# Patient Record
Sex: Female | Born: 1940 | Race: White | Hispanic: No | Smoking: Never smoker
Health system: Southern US, Community
[De-identification: ages and names within clinical notes are randomized; demographics above are authoritative.]

## PROBLEM LIST (undated history)

## (undated) DIAGNOSIS — M810 Age-related osteoporosis without current pathological fracture: Secondary | ICD-10-CM

## (undated) DIAGNOSIS — F419 Anxiety disorder, unspecified: Secondary | ICD-10-CM

## (undated) DIAGNOSIS — C801 Malignant (primary) neoplasm, unspecified: Secondary | ICD-10-CM

## (undated) DIAGNOSIS — M1990 Unspecified osteoarthritis, unspecified site: Secondary | ICD-10-CM

## (undated) HISTORY — PX: NO PAST SURGERIES: SHX2092

## (undated) HISTORY — DX: Age-related osteoporosis without current pathological fracture: M81.0

---

## 2014-08-04 ENCOUNTER — Other Ambulatory Visit (HOSPITAL_COMMUNITY): Payer: Self-pay | Admitting: Preventative Medicine

## 2014-08-04 DIAGNOSIS — M5416 Radiculopathy, lumbar region: Secondary | ICD-10-CM

## 2014-08-06 ENCOUNTER — Ambulatory Visit (HOSPITAL_COMMUNITY): Payer: Medicare Other

## 2016-08-09 ENCOUNTER — Other Ambulatory Visit: Payer: Self-pay | Admitting: Physical Medicine and Rehabilitation

## 2016-08-09 DIAGNOSIS — M5410 Radiculopathy, site unspecified: Secondary | ICD-10-CM

## 2016-10-31 ENCOUNTER — Ambulatory Visit: Payer: Self-pay | Admitting: Orthopedic Surgery

## 2016-11-09 ENCOUNTER — Ambulatory Visit: Payer: Self-pay | Admitting: Orthopedic Surgery

## 2016-11-09 NOTE — H&P (Signed)
Natalie Tran DOB: 02-01-1941 Single / Language: Cleophus Tran / Race: White Female Date of Admission:  11/22/2016 CC:  Right Hip Pain History of Present Illness The patient is a 76 year old female who comes in for a preoperative History and Physical. The patient is scheduled for a right total hip arthroplasty (anterior) to be performed by Dr. Dione Plover. Aluisio, MD at [facility] on 11-22-2016. The patient is a 76 year old female who presented with a hip problem. The patient was seen in referral from Dr. Tonita Cong. The patient reports right hip problems including pain symptoms that have been present for year(s). The symptoms began without any known injury. Symptoms reported include hip pain, night pain, stiffness, difficulty flexing hip, difficulty bearing weight and difficulty ambulating The patient reports symptoms radiating to the: right groin and right thigh. The patient describes the hip problem as aching.The patient feels as if their symptoms are does feel they are worsening. Symptoms are exacerbated by flexing hip, weight bearing and lying on the affected side. Symptoms are relieved by rest. Previous treatment for this problem has included nonsteroidal anti-inflammatory drugs (ibuprofen, prn) and opioid analgesics (Percocet). She recently had right L4-5 injection. She says that the injection helped with the pain she was having in her back. Prior to the injection, she was also having pain in the left leg. She states the left leg pain has completely resolved. er right hip, unfortunately, has gotten progressively worse in the past several months. It has been bothering her for long time, but it has gotten much worse now. She has had chronic problems with her back, but those pale in comparison to pain she is having in the hip. It is in the groin and lateral hip, radiating down the thigh to her knee. She is at a point where it was hurting all the time as well as hurting at night. It is limiting her  ability to do activities of daily living let alone do any kind of recreational activity. She even has difficulty sleeping at night. She had recent back injections that helped the back pain, but did not help at all with the hip pain. The left hip is not bothering her at this time. She is now ready to get the hip replaced at this time. They have been treated conservatively in the past for the above stated problem and despite conservative measures, they continue to have progressive pain and severe functional limitations and dysfunction. They have failed non-operative management including home exercise, medications, and injections. It is felt that they would benefit from undergoing total joint replacement. Risks and benefits of the procedure have been discussed with the patient and they elect to proceed with surgery. There are no active contraindications to surgery such as ongoing infection or rapidly progressive neurological disease.  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)  Sciatica of right side (M54.31)  Primary osteoarthritis of right hip (M16.11)  HNP (herniated nucleus pulposus), lumbar (M51.26)  Lumbar spine pain (M54.5)   Allergies  Sulfa Drugs   Family History  First Degree Relatives  reported No pertinent family history   Social History Children  5 or more Current work status  working full time Living situation  live alone Marital status  divorced Never consumed alcohol  09/10/2014: Never consumed alcohol  No history of drug/alcohol rehab  Not under pain contract  Number of flights of stairs before winded  less than 1 Tobacco / smoke exposure  09/10/2014: no Tobacco use  Never smoker. 09/10/2014  Medication History   Percocet (5-325MG  Tablet, Oral) Active. Ibuprofen (800MG  Tablet, Oral) Active.  Past Surgical History No pertinent past surgical history   Review of Systems General Not Present- Chills, Fatigue, Fever, Memory Loss, Night Sweats, Weight Gain and Weight Loss. Skin Not Present- Eczema, Hives, Itching, Lesions and Rash. HEENT Not Present- Dentures, Double Vision, Headache, Hearing Loss, Tinnitus and Visual Loss. Respiratory Not Present- Allergies, Chronic Cough, Coughing up blood, Shortness of breath at rest and Shortness of breath with exertion. Cardiovascular Not Present- Chest Pain, Difficulty Breathing Lying Down, Murmur, Palpitations, Racing/skipping heartbeats and Swelling. Gastrointestinal Not Present- Abdominal Pain, Bloody Stool, Constipation, Diarrhea, Difficulty Swallowing, Heartburn, Jaundice, Loss of appetitie, Nausea and Vomiting. Female Genitourinary Not Present- Blood in Urine, Discharge, Flank Pain, Incontinence, Painful Urination, Urgency, Urinary frequency, Urinary Retention, Urinating at Night and Weak urinary stream. Musculoskeletal Present- Joint Pain. Not Present- Back Pain, Joint Swelling, Morning Stiffness, Muscle Pain, Muscle Weakness and Spasms. Neurological Not Present- Blackout spells, Difficulty with balance, Dizziness, Paralysis, Tremor and Weakness. Psychiatric Not Present- Insomnia.  Vitals  Weight: 130 lb Height: 64in Weight was reported by patient. Height was reported by patient. Body Surface Area: 1.63 m Body Mass Index: 22.31 kg/m  Pulse: 84 (Regular)  Resp.: 16 (Unlabored)  BP: 142/76 (Sitting, Left Arm, Standard)   Physical Exam General Mental Status -Alert, cooperative and good historian. General Appearance-pleasant, Not in acute distress. Orientation-Oriented X3. Build & Nutrition-Petite, Well nourished and Well developed.  Head and Neck Head-normocephalic, atraumatic . Neck Global Assessment - supple, no bruit  auscultated on the right, no bruit auscultated on the left.  Eye Pupil - Bilateral-Regular and Round. Motion - Bilateral-EOMI.  ENMT Note: partial upper plate   Chest and Lung Exam Auscultation Breath sounds - clear at anterior chest wall and clear at posterior chest wall. Adventitious sounds - No Adventitious sounds.  Cardiovascular Auscultation Rhythm - Regular rate and rhythm. Heart Sounds - S1 WNL and S2 WNL. Murmurs & Other Heart Sounds - Auscultation of the heart reveals - No Murmurs.  Abdomen Palpation/Percussion Tenderness - Abdomen is non-tender to palpation. Rigidity (guarding) - Abdomen is soft. Auscultation Auscultation of the abdomen reveals - Bowel sounds normal.  Female Genitourinary Note: Not done, not pertinent to present illness  Musculoskeletal Note: A well-developed female, in no distress. Left hip has normal range of motion with no discomfort. Right hip can be flexed to 100, minimal internal rotation, about 20 degrees external rotation, 20 degrees abduction. She has pain on range of motion. She is about half inch short while standing. The right leg is shorter than the left. Knee exam is normal. There is just some patellofemoral crepitus, but otherwise normal exam. Pulse, sensation, motor intact. She has significantly antalgic gait pattern on the right.  RADIOGRAPHS AP pelvis and lateral of the right hip taken today show severe bone on bone arthritis with erosion of about 10 to 15% of her superolateral femoral head. The femoral head has flattened in that area. There is osteophyte formation. Left hip is minimally involved.  Assessment & Plan  Primary osteoarthritis of right hip (M16.11)  Note:Surgical Plans: Right Total Hip Replacement - Anterior Approach  Disposition: Home with family, Plan for HHPT after the hospital stay  PCP: Dr. Wenda Overland - [facility], [place]  IV TXA  Anesthesia Issues: None  Patient was instructed on what medications to stop prior  to surgery.  Signed electronically by Ok Edwards, III PA-C

## 2016-11-14 NOTE — Patient Instructions (Addendum)
Natalie Tran  11/14/2016   Your procedure is scheduled on:   Report to [facility] Main  Entrance  To check in with admitting at    200 PM.   Call this number if you have problems the morning of surgery [phone]   Remember: ONLY 1 PERSON MAY GO WITH YOU TO SHORT STAY TO GET  READY MORNING OF [place].   Do not eat food:After Midnight. YOU MAY HAVE CLEAR LIQUIDS FROM MIDNIGHT TILL 800 AM THEN NOTHING BY MOUTH    CLEAR LIQUID DIET   Foods Allowed                                                                     Foods Excluded  Coffee and tea, regular and decaf                             liquids that you cannot  Plain Jell-O in any flavor                                             see through such as: Fruit ices (not with fruit pulp)                                     milk, soups, orange juice  Iced Popsicles                                    All solid food Carbonated beverages, regular and diet                                    Cranberry, grape and apple juices Sports drinks like Gatorade Lightly seasoned clear broth or consume(fat free) Sugar, honey syrup  Sample Menu Breakfast                                Lunch                                     Supper Cranberry juice                    Beef broth                            Chicken broth Jell-O                                     Grape juice  Apple juice Coffee or tea                        Jell-O                                      Popsicle                                                Coffee or tea                        Coffee or tea  _____________________________________________________________________     Take these medicines the morning of surgery with A SIP OF WATER: NONE                                You may not have any metal on your body including hair pins and              piercings  Do not wear jewelry, make-up, lotions, powders or  perfumes, deodorant             Do not wear nail polish.  Do not shave  48 hours prior to surgery.            Do not bring valuables to the hospital. [place].  Contacts, dentures or bridgework may not be worn into surgery.  Leave suitcase in the car. After surgery it may be brought to your room.                 Please read over the following fact sheets you were given: _____________________________________________________________________             [facility] - Preparing for Surgery Before surgery, you can play an important role.  Because skin is not sterile, your skin needs to be as free of germs as possible.  You can reduce the number of germs on your skin by washing with CHG (chlorahexidine gluconate) soap before surgery.  CHG is an antiseptic cleaner which kills germs and bonds with the skin to continue killing germs even after washing. Please DO NOT use if you have an allergy to CHG or antibacterial soaps.  If your skin becomes reddened/irritated stop using the CHG and inform your nurse when you arrive at Short Stay. Do not shave (including legs and underarms) for at least 48 hours prior to the first CHG shower.  You may shave your face/neck. Please follow these instructions carefully:  1.  Shower with CHG Soap the night before surgery and the  morning of Surgery.  2.  If you choose to wash your hair, wash your hair first as usual with your  normal  shampoo.  3.  After you shampoo, rinse your hair and body thoroughly to remove the  shampoo.                           4.  Use CHG as you would any other liquid soap.  You can apply chg directly  to the  skin and wash                       Gently with a scrungie or clean washcloth.  5.  Apply the CHG Soap to your body ONLY FROM THE NECK DOWN.   Do not use on face/ open                           Wound or open sores. Avoid contact with eyes, ears mouth and genitals (private parts).                        Wash face,  Genitals (private parts) with your normal soap.             6.  Wash thoroughly, paying special attention to the area where your surgery  will be performed.  7.  Thoroughly rinse your body with warm water from the neck down.  8.  DO NOT shower/wash with your normal soap after using and rinsing off  the CHG Soap.                9.  Pat yourself dry with a clean towel.            10.  Wear clean pajamas.            11.  Place clean sheets on your bed the night of your first shower and do not  sleep with pets. Day of Surgery : Do not apply any lotions/deodorants the morning of surgery.  Please wear clean clothes to the hospital/surgery center.  FAILURE TO FOLLOW THESE INSTRUCTIONS MAY RESULT IN THE CANCELLATION OF YOUR SURGERY PATIENT SIGNATURE_________________________________  NURSE SIGNATURE__________________________________  ________________________________________________________________________  WHAT IS A BLOOD TRANSFUSION? Blood Transfusion Information  A transfusion is the replacement of blood or some of its parts. Blood is made up of multiple cells which provide different functions.  Red blood cells carry oxygen and are used for blood loss replacement.  White blood cells fight against infection.  Platelets control bleeding.  Plasma helps clot blood.  Other blood products are available for specialized needs, such as hemophilia or other clotting disorders. BEFORE THE TRANSFUSION  Who gives blood for transfusions?   Healthy volunteers who are fully evaluated to make sure their blood is safe. This is blood bank blood. Transfusion therapy is the safest it has ever been in the practice of medicine. Before blood is taken from a donor, a complete history is taken to make sure that person has no history of diseases nor engages in risky social behavior (examples are intravenous drug use or sexual activity with multiple partners). The donor's travel history is  screened to minimize risk of transmitting infections, such as malaria. The donated blood is tested for signs of infectious diseases, such as HIV and hepatitis. The blood is then tested to be sure it is compatible with you in order to minimize the chance of a transfusion reaction. If you or a relative donates blood, this is often done in anticipation of surgery and is not appropriate for emergency situations. It takes many days to process the donated blood. RISKS AND COMPLICATIONS Although transfusion therapy is very safe and saves many lives, the main dangers of transfusion include:   Getting an infectious disease.  Developing a transfusion reaction. This is an allergic reaction to something in the blood you were given. Every precaution is taken to prevent this.  The decision to have a blood transfusion has been considered carefully by your caregiver before blood is given. Blood is not given unless the benefits outweigh the risks. AFTER THE TRANSFUSION  Right after receiving a blood transfusion, you will usually feel much better and more energetic. This is especially true if your red blood cells have gotten low (anemic). The transfusion raises the level of the red blood cells which carry oxygen, and this usually causes an energy increase.  The nurse administering the transfusion will monitor you carefully for complications. HOME CARE INSTRUCTIONS  No special instructions are needed after a transfusion. You may find your energy is better. Speak with your caregiver about any limitations on activity for underlying diseases you may have. SEEK MEDICAL CARE IF:   Your condition is not improving after your transfusion.  You develop redness or irritation at the intravenous (IV) site. SEEK IMMEDIATE MEDICAL CARE IF:  Any of the following symptoms occur over the next 12 hours:  Shaking chills.  You have a temperature by mouth above 102 F (38.9 C), not controlled by medicine.  Chest, back, or muscle  pain.  People around you feel you are not acting correctly or are confused.  Shortness of breath or difficulty breathing.  Dizziness and fainting.  You get a rash or develop hives.  You have a decrease in urine output.  Your urine turns a dark color or changes to pink, red, or brown. Any of the following symptoms occur over the next 10 days:  You have a temperature by mouth above 102 F (38.9 C), not controlled by medicine.  Shortness of breath.  Weakness after normal activity.  The white part of the eye turns yellow (jaundice).  You have a decrease in the amount of urine or are urinating less often.  Your urine turns a dark color or changes to pink, red, or brown. Document Released: 08/11/2000 Document Revised: 11/06/2011 Document Reviewed: 03/30/2008 ExitCare Patient Information 2014 [place].  _______________________________________________________________________  Incentive Spirometer  An incentive spirometer is a tool that can help keep your lungs clear and active. This tool measures how well you are filling your lungs with each breath. Taking long deep breaths may help reverse or decrease the chance of developing breathing (pulmonary) problems (especially infection) following:  A long period of time when you are unable to move or be active. BEFORE THE PROCEDURE   If the spirometer includes an indicator to show your best effort, your nurse or respiratory therapist will set it to a desired goal.  If possible, sit up straight or lean slightly forward. Try not to slouch.  Hold the incentive spirometer in an upright position. INSTRUCTIONS FOR USE  1. Sit on the edge of your bed if possible, or sit up as far as you can in bed or on a chair. 2. Hold the incentive spirometer in an upright position. 3. Breathe out normally. 4. Place the mouthpiece in your mouth and seal your lips tightly around it. 5. Breathe in slowly and as deeply as possible, raising the piston  or the ball toward the top of the column. 6. Hold your breath for 3-5 seconds or for as long as possible. Allow the piston or ball to fall to the bottom of the column. 7. Remove the mouthpiece from your mouth and breathe out normally. 8. Rest for a few seconds and repeat Steps 1 through 7 at least 10 times every 1-2 hours when you are awake. Take your time and take a  few normal breaths between deep breaths. 9. The spirometer may include an indicator to show your best effort. Use the indicator as a goal to work toward during each repetition. 10. After each set of 10 deep breaths, practice coughing to be sure your lungs are clear. If you have an incision (the cut made at the time of surgery), support your incision when coughing by placing a pillow or rolled up towels firmly against it. Once you are able to get out of bed, walk around indoors and cough well. You may stop using the incentive spirometer when instructed by your caregiver.  RISKS AND COMPLICATIONS  Take your time so you do not get dizzy or light-headed.  If you are in pain, you may need to take or ask for pain medication before doing incentive spirometry. It is harder to take a deep breath if you are having pain. AFTER USE  Rest and breathe slowly and easily.  It can be helpful to keep track of a log of your progress. Your caregiver can provide you with a simple table to help with this. If you are using the spirometer at home, follow these instructions: [place] IF:   You are having difficultly using the spirometer.  You have trouble using the spirometer as often as instructed.  Your pain medication is not giving enough relief while using the spirometer.  You develop fever of 100.5 F (38.1 C) or higher. SEEK IMMEDIATE MEDICAL CARE IF:   You cough up bloody sputum that had not been present before.  You develop fever of 102 F (38.9 C) or greater.  You develop worsening pain at or near the incision site. MAKE  SURE YOU:   Understand these instructions.  Will watch your condition.  Will get help right away if you are not doing well or get worse. Document Released: 12/25/2006 Document Revised: 11/06/2011 Document Reviewed: 02/25/2007 [facility] Patient Information 2014 [place], [place].   ________________________________________________________________________

## 2016-11-15 ENCOUNTER — Encounter (INDEPENDENT_AMBULATORY_CARE_PROVIDER_SITE_OTHER): Payer: Self-pay

## 2016-11-15 ENCOUNTER — Encounter (HOSPITAL_COMMUNITY)
Admission: RE | Admit: 2016-11-15 | Discharge: 2016-11-15 | Disposition: A | Discharge status: Home / Self Care | Payer: Medicare Other | Source: Ambulatory Visit | Attending: Orthopedic Surgery | Admitting: Orthopedic Surgery

## 2016-11-15 ENCOUNTER — Encounter (HOSPITAL_COMMUNITY): Payer: Self-pay

## 2016-11-15 DIAGNOSIS — M1611 Unilateral primary osteoarthritis, right hip: Secondary | ICD-10-CM | POA: Insufficient documentation

## 2016-11-15 DIAGNOSIS — Z01812 Encounter for preprocedural laboratory examination: Secondary | ICD-10-CM | POA: Insufficient documentation

## 2016-11-15 HISTORY — DX: Anxiety disorder, unspecified: F41.9

## 2016-11-15 HISTORY — DX: Malignant (primary) neoplasm, unspecified: C80.1

## 2016-11-15 HISTORY — DX: Unspecified osteoarthritis, unspecified site: M19.90

## 2016-11-15 LAB — COMPREHENSIVE METABOLIC PANEL
ALT: 16 U/L (ref 14–54)
ANION GAP: 9 (ref 5–15)
AST: 23 U/L (ref 15–41)
Albumin: 4.3 g/dL (ref 3.5–5.0)
Alkaline Phosphatase: 81 U/L (ref 38–126)
BUN: 17 mg/dL (ref 6–20)
CHLORIDE: 103 mmol/L (ref 101–111)
CO2: 28 mmol/L (ref 22–32)
Calcium: 10.3 mg/dL (ref 8.9–10.3)
Creatinine, Ser: 0.73 mg/dL (ref 0.44–1.00)
GFR calc non Af Amer: >60 mL/min (ref >60)
Glucose, Bld: 96 mg/dL (ref 65–99)
POTASSIUM: 4.8 mmol/L (ref 3.5–5.1)
SODIUM: 140 mmol/L (ref 135–145)
Total Bilirubin: 0.5 mg/dL (ref 0.3–1.2)
Total Protein: 8.1 g/dL (ref 6.5–8.1)

## 2016-11-15 LAB — CBC
HCT: 40.7 % (ref 36.0–46.0)
Hemoglobin: 13.3 g/dL (ref 12.0–15.0)
MCH: 30.4 pg (ref 26.0–34.0)
MCHC: 32.7 g/dL (ref 30.0–36.0)
MCV: 93.1 fL (ref 78.0–100.0)
PLATELETS: 195 10*3/uL (ref 150–400)
RBC: 4.37 MIL/uL (ref 3.87–5.11)
RDW: 14 % (ref 11.5–15.5)
WBC: 7.6 10*3/uL (ref 4.0–10.5)

## 2016-11-15 LAB — PROTIME-INR
INR: 0.96
Prothrombin Time: 12.8 seconds (ref 11.4–15.2)

## 2016-11-15 LAB — ABO/RH: ABO/RH(D): O POS

## 2016-11-15 LAB — SURGICAL PCR SCREEN
MRSA, PCR: INVALID — AB
Staphylococcus aureus: INVALID — AB

## 2016-11-15 LAB — APTT: aPTT: 30 seconds (ref 24–36)

## 2016-11-18 LAB — MRSA CULTURE: Culture: NOT DETECTED

## 2016-11-20 NOTE — Progress Notes (Signed)
Spoke with [facility] office regarding her surgery time change. Pt. Was informed to be at Short stay on the first floor on 11/22/16 at 0510 am and to not eat or drink anything after midnight. Patient VU.

## 2016-11-22 ENCOUNTER — Encounter (HOSPITAL_COMMUNITY)
Admission: RE | Disposition: A | Discharge status: Home Health | Payer: Self-pay | Source: Ambulatory Visit | Attending: Orthopedic Surgery

## 2016-11-22 ENCOUNTER — Inpatient Hospital Stay (HOSPITAL_COMMUNITY): Payer: Medicare Other | Admitting: Anesthesiology

## 2016-11-22 ENCOUNTER — Inpatient Hospital Stay (HOSPITAL_COMMUNITY)
Admission: RE | Admit: 2016-11-22 | Discharge: 2016-11-23 | DRG: 470 | Disposition: A | Discharge status: Home Health | Payer: Medicare Other | Source: Ambulatory Visit | Attending: Orthopedic Surgery | Admitting: Orthopedic Surgery

## 2016-11-22 ENCOUNTER — Encounter (HOSPITAL_COMMUNITY): Payer: Self-pay | Admitting: *Deleted

## 2016-11-22 ENCOUNTER — Inpatient Hospital Stay (HOSPITAL_COMMUNITY): Payer: Medicare Other

## 2016-11-22 DIAGNOSIS — M48062 Spinal stenosis, lumbar region with neurogenic claudication: Secondary | ICD-10-CM | POA: Diagnosis present

## 2016-11-22 DIAGNOSIS — Z882 Allergy status to sulfonamides status: Secondary | ICD-10-CM

## 2016-11-22 DIAGNOSIS — M5136 Other intervertebral disc degeneration, lumbar region: Secondary | ICD-10-CM | POA: Diagnosis present

## 2016-11-22 DIAGNOSIS — M1611 Unilateral primary osteoarthritis, right hip: Secondary | ICD-10-CM | POA: Diagnosis present

## 2016-11-22 DIAGNOSIS — M169 Osteoarthritis of hip, unspecified: Secondary | ICD-10-CM | POA: Diagnosis present

## 2016-11-22 DIAGNOSIS — M48061 Spinal stenosis, lumbar region without neurogenic claudication: Secondary | ICD-10-CM | POA: Diagnosis present

## 2016-11-22 DIAGNOSIS — M5126 Other intervertebral disc displacement, lumbar region: Secondary | ICD-10-CM | POA: Diagnosis present

## 2016-11-22 DIAGNOSIS — Z96649 Presence of unspecified artificial hip joint: Secondary | ICD-10-CM

## 2016-11-22 HISTORY — PX: TOTAL HIP ARTHROPLASTY: SHX124

## 2016-11-22 LAB — TYPE AND SCREEN
ABO/RH(D): O POS
ANTIBODY SCREEN: NEGATIVE

## 2016-11-22 SURGERY — ARTHROPLASTY, HIP, TOTAL, ANTERIOR APPROACH
Anesthesia: Monitor Anesthesia Care | Site: Hip | Laterality: Right

## 2016-11-22 MED ORDER — DIPHENHYDRAMINE HCL 12.5 MG/5ML PO ELIX: 12.5000 mg | ORAL_SOLUTION | ORAL | Status: DC | PRN

## 2016-11-22 MED ORDER — RIVAROXABAN 10 MG PO TABS
10.0000 mg | ORAL_TABLET | Freq: Every day | ORAL | Status: DC
  Administered 2016-11-23: 10 mg via ORAL
  Filled 2016-11-22: qty 1

## 2016-11-22 MED ORDER — SODIUM CHLORIDE 0.9 % IR SOLN
Status: DC | PRN
  Administered 2016-11-22: 1000 mL

## 2016-11-22 MED ORDER — ONDANSETRON HCL 4 MG/2ML IJ SOLN
INTRAMUSCULAR | Status: DC | PRN
  Administered 2016-11-22: 4 mg via INTRAVENOUS

## 2016-11-22 MED ORDER — CHLORHEXIDINE GLUCONATE 4 % EX LIQD: 60.0000 mL | Freq: Once | CUTANEOUS | Status: DC

## 2016-11-22 MED ORDER — TRAMADOL HCL 50 MG PO TABS: 50.0000 mg | ORAL_TABLET | Freq: Four times a day (QID) | ORAL | Status: DC | PRN

## 2016-11-22 MED ORDER — ONDANSETRON HCL 4 MG/2ML IJ SOLN: 4.0000 mg | Freq: Four times a day (QID) | INTRAMUSCULAR | Status: DC | PRN

## 2016-11-22 MED ORDER — PHENYLEPHRINE 40 MCG/ML (10ML) SYRINGE FOR IV PUSH (FOR BLOOD PRESSURE SUPPORT)
PREFILLED_SYRINGE | INTRAVENOUS | Status: AC
  Filled 2016-11-22: qty 10

## 2016-11-22 MED ORDER — FLEET ENEMA 7-19 GM/118ML RE ENEM: 1.0000 | ENEMA | Freq: Once | RECTAL | Status: DC | PRN

## 2016-11-22 MED ORDER — OXYCODONE HCL 5 MG/5ML PO SOLN: 5.0000 mg | Freq: Once | ORAL | Status: DC | PRN

## 2016-11-22 MED ORDER — PROMETHAZINE HCL 25 MG/ML IJ SOLN: 6.2500 mg | INTRAMUSCULAR | Status: DC | PRN

## 2016-11-22 MED ORDER — DOCUSATE SODIUM 100 MG PO CAPS
100.0000 mg | ORAL_CAPSULE | Freq: Two times a day (BID) | ORAL | Status: DC
  Administered 2016-11-22 – 2016-11-23 (×2): 100 mg via ORAL
  Filled 2016-11-22 (×2): qty 1

## 2016-11-22 MED ORDER — LIDOCAINE 2% (20 MG/ML) 5 ML SYRINGE
INTRAMUSCULAR | Status: AC
  Filled 2016-11-22: qty 5

## 2016-11-22 MED ORDER — PHENOL 1.4 % MT LIQD: 1.0000 | OROMUCOSAL | Status: DC | PRN

## 2016-11-22 MED ORDER — OXYCODONE HCL 5 MG PO TABS: 5.0000 mg | ORAL_TABLET | Freq: Once | ORAL | Status: DC | PRN

## 2016-11-22 MED ORDER — MENTHOL 3 MG MT LOZG: 1.0000 | LOZENGE | OROMUCOSAL | Status: DC | PRN

## 2016-11-22 MED ORDER — HYDROMORPHONE HCL 1 MG/ML IJ SOLN: 0.2500 mg | INTRAMUSCULAR | Status: DC | PRN

## 2016-11-22 MED ORDER — LACTATED RINGERS IV SOLN
INTRAVENOUS | Status: DC
  Administered 2016-11-22 (×2): via INTRAVENOUS

## 2016-11-22 MED ORDER — MIDAZOLAM HCL 2 MG/2ML IJ SOLN
INTRAMUSCULAR | Status: AC
  Filled 2016-11-22: qty 2

## 2016-11-22 MED ORDER — PROPOFOL 10 MG/ML IV BOLUS
INTRAVENOUS | Status: DC | PRN
  Administered 2016-11-22: 100 mg via INTRAVENOUS
  Administered 2016-11-22: 20 mg via INTRAVENOUS

## 2016-11-22 MED ORDER — BUPIVACAINE HCL (PF) 0.25 % IJ SOLN
INTRAMUSCULAR | Status: AC
  Filled 2016-11-22: qty 30

## 2016-11-22 MED ORDER — EPHEDRINE 5 MG/ML INJ
INTRAVENOUS | Status: AC
  Filled 2016-11-22: qty 10

## 2016-11-22 MED ORDER — POLYETHYLENE GLYCOL 3350 17 G PO PACK: 17.0000 g | PACK | Freq: Every day | ORAL | Status: DC | PRN

## 2016-11-22 MED ORDER — ONDANSETRON HCL 4 MG PO TABS: 4.0000 mg | ORAL_TABLET | Freq: Four times a day (QID) | ORAL | Status: DC | PRN

## 2016-11-22 MED ORDER — CEFAZOLIN SODIUM-DEXTROSE 2-4 GM/100ML-% IV SOLN
2.0000 g | INTRAVENOUS | Status: AC
  Administered 2016-11-22: 2 g via INTRAVENOUS

## 2016-11-22 MED ORDER — ONDANSETRON HCL 4 MG/2ML IJ SOLN
INTRAMUSCULAR | Status: AC
  Filled 2016-11-22: qty 4

## 2016-11-22 MED ORDER — ACETAMINOPHEN 325 MG PO TABS: 650.0000 mg | ORAL_TABLET | Freq: Four times a day (QID) | ORAL | Status: DC | PRN

## 2016-11-22 MED ORDER — ACETAMINOPHEN 650 MG RE SUPP: 650.0000 mg | Freq: Four times a day (QID) | RECTAL | Status: DC | PRN

## 2016-11-22 MED ORDER — ACETAMINOPHEN 10 MG/ML IV SOLN
INTRAVENOUS | Status: AC
  Filled 2016-11-22: qty 100

## 2016-11-22 MED ORDER — BUPIVACAINE HCL (PF) 0.5 % IJ SOLN
INTRAMUSCULAR | Status: AC
  Filled 2016-11-22: qty 30

## 2016-11-22 MED ORDER — ACETAMINOPHEN 500 MG PO TABS
1000.0000 mg | ORAL_TABLET | Freq: Four times a day (QID) | ORAL | Status: AC
  Administered 2016-11-22 – 2016-11-23 (×4): 1000 mg via ORAL
  Filled 2016-11-22 (×4): qty 2

## 2016-11-22 MED ORDER — MORPHINE SULFATE (PF) 4 MG/ML IV SOLN
1.0000 mg | INTRAVENOUS | Status: DC | PRN
  Filled 2016-11-22: qty 1

## 2016-11-22 MED ORDER — BUPIVACAINE HCL (PF) 0.25 % IJ SOLN
INTRAMUSCULAR | Status: DC | PRN
  Administered 2016-11-22: 30 mL

## 2016-11-22 MED ORDER — ACETAMINOPHEN 10 MG/ML IV SOLN
1000.0000 mg | Freq: Once | INTRAVENOUS | Status: AC
Start: 2016-11-22 — End: 2016-11-22
  Administered 2016-11-22: 1000 mg via INTRAVENOUS

## 2016-11-22 MED ORDER — METHOCARBAMOL 500 MG PO TABS
500.0000 mg | ORAL_TABLET | Freq: Four times a day (QID) | ORAL | Status: DC | PRN
  Administered 2016-11-23 (×2): 500 mg via ORAL
  Filled 2016-11-22 (×3): qty 1

## 2016-11-22 MED ORDER — PROPOFOL 500 MG/50ML IV EMUL
INTRAVENOUS | Status: DC | PRN
  Administered 2016-11-22: 50 ug/kg/min via INTRAVENOUS

## 2016-11-22 MED ORDER — OXYCODONE HCL 5 MG PO TABS
5.0000 mg | ORAL_TABLET | ORAL | Status: DC | PRN
  Administered 2016-11-22 (×3): 5 mg via ORAL
  Administered 2016-11-22: 10 mg via ORAL
  Administered 2016-11-23: 5 mg via ORAL
  Administered 2016-11-23 (×2): 10 mg via ORAL
  Administered 2016-11-23: 5 mg via ORAL
  Filled 2016-11-22: qty 2
  Filled 2016-11-22: qty 1
  Filled 2016-11-22: qty 2
  Filled 2016-11-22 (×2): qty 1
  Filled 2016-11-22 (×2): qty 2
  Filled 2016-11-22 (×2): qty 1
  Filled 2016-11-22: qty 2

## 2016-11-22 MED ORDER — SODIUM CHLORIDE 0.9 % IV SOLN
INTRAVENOUS | Status: DC
  Administered 2016-11-22 – 2016-11-23 (×2): via INTRAVENOUS

## 2016-11-22 MED ORDER — LIDOCAINE 2% (20 MG/ML) 5 ML SYRINGE
INTRAMUSCULAR | Status: DC | PRN
  Administered 2016-11-22: 60 mg via INTRAVENOUS

## 2016-11-22 MED ORDER — PROPOFOL 10 MG/ML IV BOLUS
INTRAVENOUS | Status: AC
  Filled 2016-11-22: qty 60

## 2016-11-22 MED ORDER — SODIUM CHLORIDE 0.9 % IV SOLN
1000.0000 mg | INTRAVENOUS | Status: AC
  Administered 2016-11-22: 1000 mg via INTRAVENOUS
  Filled 2016-11-22: qty 1100

## 2016-11-22 MED ORDER — MIDAZOLAM HCL 2 MG/2ML IJ SOLN
INTRAMUSCULAR | Status: DC | PRN
  Administered 2016-11-22 (×2): 1 mg via INTRAVENOUS

## 2016-11-22 MED ORDER — DEXAMETHASONE SODIUM PHOSPHATE 10 MG/ML IJ SOLN
10.0000 mg | Freq: Once | INTRAMUSCULAR | Status: AC
  Administered 2016-11-22: 10 mg via INTRAVENOUS

## 2016-11-22 MED ORDER — METOCLOPRAMIDE HCL 5 MG PO TABS: 5.0000 mg | ORAL_TABLET | Freq: Three times a day (TID) | ORAL | Status: DC | PRN

## 2016-11-22 MED ORDER — DEXAMETHASONE SODIUM PHOSPHATE 10 MG/ML IJ SOLN
10.0000 mg | Freq: Once | INTRAMUSCULAR | Status: AC
  Administered 2016-11-23: 10 mg via INTRAVENOUS
  Filled 2016-11-22: qty 1

## 2016-11-22 MED ORDER — PHENYLEPHRINE HCL 10 MG/ML IJ SOLN
INTRAMUSCULAR | Status: DC | PRN
  Administered 2016-11-22 (×7): 80 ug via INTRAVENOUS

## 2016-11-22 MED ORDER — EPHEDRINE SULFATE 50 MG/ML IJ SOLN
INTRAMUSCULAR | Status: DC | PRN
  Administered 2016-11-22: 10 mg via INTRAVENOUS

## 2016-11-22 MED ORDER — DEXAMETHASONE SODIUM PHOSPHATE 10 MG/ML IJ SOLN
INTRAMUSCULAR | Status: AC
  Filled 2016-11-22: qty 1

## 2016-11-22 MED ORDER — METHOCARBAMOL 1000 MG/10ML IJ SOLN
500.0000 mg | Freq: Four times a day (QID) | INTRAVENOUS | Status: DC | PRN
  Administered 2016-11-22: 500 mg via INTRAVENOUS
  Filled 2016-11-22: qty 5
  Filled 2016-11-22: qty 550

## 2016-11-22 MED ORDER — BISACODYL 10 MG RE SUPP: 10.0000 mg | Freq: Every day | RECTAL | Status: DC | PRN

## 2016-11-22 MED ORDER — TRANEXAMIC ACID 1000 MG/10ML IV SOLN
1000.0000 mg | Freq: Once | INTRAVENOUS | Status: AC
  Administered 2016-11-22: 1000 mg via INTRAVENOUS
  Filled 2016-11-22: qty 1100

## 2016-11-22 MED ORDER — CEFAZOLIN SODIUM-DEXTROSE 2-4 GM/100ML-% IV SOLN
2.0000 g | Freq: Four times a day (QID) | INTRAVENOUS | Status: AC
  Administered 2016-11-22 (×2): 2 g via INTRAVENOUS
  Filled 2016-11-22 (×2): qty 100

## 2016-11-22 MED ORDER — METOCLOPRAMIDE HCL 5 MG/ML IJ SOLN: 5.0000 mg | Freq: Three times a day (TID) | INTRAMUSCULAR | Status: DC | PRN

## 2016-11-22 MED ORDER — CEFAZOLIN SODIUM-DEXTROSE 2-4 GM/100ML-% IV SOLN
INTRAVENOUS | Status: AC
  Filled 2016-11-22: qty 100

## 2016-11-22 SURGICAL SUPPLY — 35 items
BAG DECANTER FOR FLEXI CONT (MISCELLANEOUS) ×3 IMPLANT
BAG ZIPLOCK 12X15 (MISCELLANEOUS) IMPLANT
BLADE SAG 18X100X1.27 (BLADE) ×3 IMPLANT
CAPT HIP TOTAL 2 ×3 IMPLANT
CLOSURE WOUND 1/2 X4 (GAUZE/BANDAGES/DRESSINGS) ×1
CLOTH BEACON ORANGE TIMEOUT ST (SAFETY) ×3 IMPLANT
COVER PERINEAL POST (MISCELLANEOUS) ×3 IMPLANT
DECANTER SPIKE VIAL GLASS SM (MISCELLANEOUS) ×3 IMPLANT
DRAPE STERI IOBAN 125X83 (DRAPES) ×3 IMPLANT
DRAPE U-SHAPE 47X51 STRL (DRAPES) ×6 IMPLANT
DRSG ADAPTIC 3X8 NADH LF (GAUZE/BANDAGES/DRESSINGS) ×3 IMPLANT
DRSG MEPILEX BORDER 4X4 (GAUZE/BANDAGES/DRESSINGS) ×3 IMPLANT
DRSG MEPILEX BORDER 4X8 (GAUZE/BANDAGES/DRESSINGS) ×3 IMPLANT
DURAPREP 26ML APPLICATOR (WOUND CARE) ×3 IMPLANT
ELECT REM PT RETURN 15FT ADLT (MISCELLANEOUS) ×3 IMPLANT
EVACUATOR 1/8 PVC DRAIN (DRAIN) ×3 IMPLANT
GLOVE BIO SURGEON STRL SZ7.5 (GLOVE) ×3 IMPLANT
GLOVE BIO SURGEON STRL SZ8 (GLOVE) ×6 IMPLANT
GLOVE BIOGEL PI IND STRL 6.5 (GLOVE) ×4 IMPLANT
GLOVE BIOGEL PI IND STRL 8 (GLOVE) ×2 IMPLANT
GLOVE BIOGEL PI INDICATOR 6.5 (GLOVE) ×8
GLOVE BIOGEL PI INDICATOR 8 (GLOVE) ×4
GOWN STRL REUS W/TWL LRG LVL3 (GOWN DISPOSABLE) ×9 IMPLANT
GOWN STRL REUS W/TWL XL LVL3 (GOWN DISPOSABLE) ×3 IMPLANT
PACK ANTERIOR HIP CUSTOM (KITS) ×3 IMPLANT
STRIP CLOSURE SKIN 1/2X4 (GAUZE/BANDAGES/DRESSINGS) ×2 IMPLANT
SUT ETHIBOND NAB CT1 #1 30IN (SUTURE) ×3 IMPLANT
SUT MNCRL AB 4-0 PS2 18 (SUTURE) ×3 IMPLANT
SUT STRATAFIX 0 PDS 27 VIOLET (SUTURE) ×3
SUT VIC AB 2-0 CT1 27 (SUTURE) ×4
SUT VIC AB 2-0 CT1 TAPERPNT 27 (SUTURE) ×2 IMPLANT
SUTURE STRATFX 0 PDS 27 VIOLET (SUTURE) ×1 IMPLANT
SYR 50ML LL SCALE MARK (SYRINGE) IMPLANT
TRAY FOLEY CATH 14FRSI W/METER (CATHETERS) ×3 IMPLANT
YANKAUER SUCT BULB TIP 10FT TU (MISCELLANEOUS) ×3 IMPLANT

## 2016-11-22 NOTE — Anesthesia Postprocedure Evaluation (Signed)
Anesthesia Post Note  Patient: Olar Santini  Procedure(s) Performed: Procedure(s) (LRB): RIGHT TOTAL HIP ARTHROPLASTY ANTERIOR APPROACH (Right)  Patient location during evaluation: PACU Anesthesia Type: MAC Level of consciousness: awake and alert Pain management: pain level controlled Vital Signs Assessment: post-procedure vital signs reviewed and stable Respiratory status: spontaneous breathing, nonlabored ventilation and respiratory function stable Cardiovascular status: blood pressure returned to baseline and stable Postop Assessment: no signs of nausea or vomiting Anesthetic complications: no       Last Vitals:  Vitals:   11/22/16 0950 11/22/16 1013  BP: (!) 130/58 131/77  Pulse: 70 80  Resp: 14 14  Temp: 36.4 C 36.9 C    Last Pain:  Vitals:   11/22/16 0545  TempSrc: Oral                 Lynda Rainwater

## 2016-11-22 NOTE — Interval H&P Note (Signed)
History and Physical Interval Note:  11/22/2016 6:36 AM  Natalie Tran  has presented today for surgery, with the diagnosis of Osteoarthritis right hip  The various methods of treatment have been discussed with the patient and family. After consideration of risks, benefits and other options for treatment, the patient has consented to  Procedure(s): RIGHT TOTAL HIP ARTHROPLASTY ANTERIOR APPROACH (Right) as a surgical intervention .  The patient's history has been reviewed, patient examined, no change in status, stable for surgery.  I have reviewed the patient's chart and labs.  Questions were answered to the patient's satisfaction.     [name]

## 2016-11-22 NOTE — Anesthesia Procedure Notes (Signed)
Spinal  Patient location during procedure: OR Start time: 11/22/2016 7:20 AM End time: 11/22/2016 7:25 AM Staffing Anesthesiologist: Candida Peeling RAY Performed: anesthesiologist  Preanesthetic Checklist Completed: patient identified, site marked, surgical consent, pre-op evaluation, timeout performed, IV checked, risks and benefits discussed and monitors and equipment checked Spinal Block Patient position: sitting Prep: Betadine Patient monitoring: continuous pulse ox, blood pressure, cardiac monitor and heart rate Approach: midline Location: L3-4 Injection technique: single-shot Needle Needle type: Quincke  Needle gauge: 22 G Assessment Events: failed spinal Additional Notes Patient reacted to incision.  LMA placed without difficulty.

## 2016-11-22 NOTE — Anesthesia Preprocedure Evaluation (Signed)
Anesthesia Evaluation  Patient identified by MRN, date of birth, ID band Patient awake    Reviewed: Allergy & Precautions, NPO status , Patient's Chart, lab work & pertinent test results  Airway Mallampati: II  TM Distance: >3 FB Neck ROM: Full    Dental no notable dental hx.    Pulmonary neg pulmonary ROS,    Pulmonary exam normal breath sounds clear to auscultation       Cardiovascular negative cardio ROS Normal cardiovascular exam Rhythm:Regular Rate:Normal     Neuro/Psych negative neurological ROS  negative psych ROS   GI/Hepatic negative GI ROS, Neg liver ROS,   Endo/Other  negative endocrine ROS  Renal/GU negative Renal ROS  negative genitourinary   Musculoskeletal negative musculoskeletal ROS (+) Arthritis ,   Abdominal   Peds negative pediatric ROS (+)  Hematology negative hematology ROS (+)   Anesthesia Other Findings   Reproductive/Obstetrics negative OB ROS                             Anesthesia Physical Anesthesia Plan  ASA: II  Anesthesia Plan: Spinal and MAC   Post-op Pain Management:    Induction: Intravenous  Airway Management Planned: Simple Face Mask  Additional Equipment:   Intra-op Plan:   Post-operative Plan:   Informed Consent: I have reviewed the patients History and Physical, chart, labs and discussed the procedure including the risks, benefits and alternatives for the proposed anesthesia with the patient or authorized representative who has indicated his/her understanding and acceptance.   Dental advisory given  Plan Discussed with: CRNA  Anesthesia Plan Comments:         Anesthesia Quick Evaluation

## 2016-11-22 NOTE — H&P (View-Only) (Signed)
Natalie Tran DOB: May 20, 1941 Single / Language: [name] / Race: White Female Date of Admission:  11/22/2016 CC:  Right Hip Pain History of Present Illness The patient is a 76 year old female who comes in for a preoperative History and Physical. The patient is scheduled for a right total hip arthroplasty (anterior) to be performed by Dr. Dione Plover. Aluisio, MD at [facility] on 11-22-2016. The patient is a 76 year old female who presented with a hip problem. The patient was seen in referral from Dr. Tonita Cong. The patient reports right hip problems including pain symptoms that have been present for year(s). The symptoms began without any known injury. Symptoms reported include hip pain, night pain, stiffness, difficulty flexing hip, difficulty bearing weight and difficulty ambulating The patient reports symptoms radiating to the: right groin and right thigh. The patient describes the hip problem as aching.The patient feels as if their symptoms are does feel they are worsening. Symptoms are exacerbated by flexing hip, weight bearing and lying on the affected side. Symptoms are relieved by rest. Previous treatment for this problem has included nonsteroidal anti-inflammatory drugs (ibuprofen, prn) and opioid analgesics (Percocet). She recently had right L4-5 injection. She says that the injection helped with the pain she was having in her back. Prior to the injection, she was also having pain in the left leg. She states the left leg pain has completely resolved. er right hip, unfortunately, has gotten progressively worse in the past several months. It has been bothering her for long time, but it has gotten much worse now. She has had chronic problems with her back, but those pale in comparison to pain she is having in the hip. It is in the groin and lateral hip, radiating down the thigh to her knee. She is at a point where it was hurting all the time as well as hurting at night. It is limiting her  ability to do activities of daily living let alone do any kind of recreational activity. She even has difficulty sleeping at night. She had recent back injections that helped the back pain, but did not help at all with the hip pain. The left hip is not bothering her at this time. She is now ready to get the hip replaced at this time. They have been treated conservatively in the past for the above stated problem and despite conservative measures, they continue to have progressive pain and severe functional limitations and dysfunction. They have failed non-operative management including home exercise, medications, and injections. It is felt that they would benefit from undergoing total joint replacement. Risks and benefits of the procedure have been discussed with the patient and they elect to proceed with surgery. There are no active contraindications to surgery such as ongoing infection or rapidly progressive neurological disease.  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 09/10/2014  Medication History   Percocet (5-325MG  Tablet, Oral) Active. Ibuprofen (800MG  Tablet, Oral) Active.  Past Surgical History No pertinent past surgical history   Review of Systems General Not Present- Chills, Fatigue, Fever, Memory Loss, Night Sweats, Weight Gain and Weight Loss. Skin Not Present- Eczema, Hives, Itching, Lesions and Rash. HEENT Not Present- Dentures, Double Vision, Headache, Hearing Loss, Tinnitus and Visual Loss. Respiratory Not Present- Allergies, Chronic Cough, Coughing up blood, Shortness of breath at rest and Shortness of breath with exertion. Cardiovascular Not Present- Chest Pain, Difficulty Breathing Lying Down, Murmur, Palpitations, Racing/skipping heartbeats and Swelling. Gastrointestinal Not Present- Abdominal Pain, Bloody Stool, Constipation, Diarrhea, Difficulty Swallowing, Heartburn, Jaundice, Loss of appetitie, Nausea and Vomiting. Female Genitourinary Not Present- Blood in Urine, Discharge, Flank Pain, Incontinence, Painful Urination, Urgency, Urinary frequency, Urinary Retention, Urinating at Night and Weak urinary stream. Musculoskeletal Present- Joint Pain. Not Present- Back Pain, Joint Swelling, Morning Stiffness, Muscle Pain, Muscle Weakness and Spasms. Neurological Not Present- Blackout spells, Difficulty with balance, Dizziness, Paralysis, Tremor and Weakness. Psychiatric Not Present- Insomnia.  Vitals  Weight: 130 lb Height: 64in Weight was reported by patient. Height was reported by patient. Body Surface Area: 1.63 m Body Mass Index: 22.31 kg/m  Pulse: 84 (Regular)  Resp.: 16 (Unlabored)  BP: 142/76 (Sitting, Left Arm, Standard)   Physical Exam General Mental Status -Alert, cooperative and good historian. General Appearance-pleasant, Not in acute distress. Orientation-Oriented X3. Build & Nutrition-Petite, Well nourished and Well developed.  Head and Neck Head-normocephalic, atraumatic . Neck Global Assessment - supple, no bruit  auscultated on the right, no bruit auscultated on the left.  Eye Pupil - Bilateral-Regular and Round. Motion - Bilateral-EOMI.  ENMT Note: partial upper plate   Chest and Lung Exam Auscultation Breath sounds - clear at anterior chest wall and clear at posterior chest wall. Adventitious sounds - No Adventitious sounds.  Cardiovascular Auscultation Rhythm - Regular rate and rhythm. Heart Sounds - S1 WNL and S2 WNL. Murmurs & Other Heart Sounds - Auscultation of the heart reveals - No Murmurs.  Abdomen Palpation/Percussion Tenderness - Abdomen is non-tender to palpation. Rigidity (guarding) - Abdomen is soft. Auscultation Auscultation of the abdomen reveals - Bowel sounds normal.  Female Genitourinary Note: Not done, not pertinent to present illness  Musculoskeletal Note: A well-developed female, in no distress. Left hip has normal range of motion with no discomfort. Right hip can be flexed to 100, minimal internal rotation, about 20 degrees external rotation, 20 degrees abduction. She has pain on range of motion. She is about half inch short while standing. The right leg is shorter than the left. Knee exam is normal. There is just some patellofemoral crepitus, but otherwise normal exam. Pulse, sensation, motor intact. She has significantly antalgic gait pattern on the right.  RADIOGRAPHS AP pelvis and lateral of the right hip taken today show severe bone on bone arthritis with erosion of about 10 to 15% of her superolateral femoral head. The femoral head has flattened in that area. There is osteophyte formation. Left hip is minimally involved.  Assessment & Plan  Primary osteoarthritis of right hip (M16.11)  Note:Surgical Plans: Right Total Hip Replacement - Anterior Approach  Disposition: Home with family, Plan for HHPT after the hospital stay  PCP: Dr. Wenda Overland - [facility], [place]  IV [facility]  Anesthesia Issues: None  Patient was instructed on what medications to stop prior  to surgery.  Signed electronically by Ok Edwards, III PA-C

## 2016-11-22 NOTE — Anesthesia Procedure Notes (Signed)
Procedure Name: MAC Date/Time: 11/22/2016 7:27 AM Performed by: Dione Booze Pre-anesthesia Checklist: Patient identified, Emergency Drugs available, Suction available and Patient being monitored Oxygen Delivery Method: Simple face mask Placement Confirmation: positive ETCO2

## 2016-11-22 NOTE — Anesthesia Procedure Notes (Signed)
Procedure Name: LMA Insertion Date/Time: 11/22/2016 7:54 AM Performed by: Dione Booze Pre-anesthesia Checklist: Emergency Drugs available, Suction available, Patient being monitored and Patient identified Patient Re-evaluated:Patient Re-evaluated prior to inductionOxygen Delivery Method: Circle system utilized Preoxygenation: Pre-oxygenation with 100% oxygen Intubation Type: IV induction LMA: LMA with gastric port inserted LMA Size: 4.0 Number of attempts: 1 Placement Confirmation: positive ETCO2 and breath sounds checked- equal and bilateral Tube secured with: Tape Dental Injury: Teeth and Oropharynx as per pre-operative assessment

## 2016-11-22 NOTE — Evaluation (Signed)
Physical Therapy Evaluation Patient Details Name: Natalie Tran MRN: [id number] DOB: 02/19/1941 Today's Date: 11/22/2016   History of Present Illness  Pt s/p R THR and   Clinical Impression  Pt s.p R THR and presents with decreased R LE strength/ROM and post op pain limiting functional mobility.  Pt should progress to dc home with family assist.    Follow Up Recommendations Home health PT    Equipment Recommendations  None recommended by PT    Recommendations for Other Services       Precautions / Restrictions Precautions Precautions: Fall Restrictions Weight Bearing Restrictions: No Other Position/Activity Restrictions: WBAT      Mobility  Bed Mobility Overal bed mobility: Needs Assistance Bed Mobility: Supine to Sit     Supine to sit: Min assist;Mod assist     General bed mobility comments: cues for sequence and use of L LE to self assist.  Physical assist to manage R LE and to bring trunk to upright  Transfers Overall transfer level: Needs assistance Equipment used: Rolling walker (2 wheeled) Transfers: Sit to/from Stand Sit to Stand: Min assist;Mod assist         General transfer comment: cues for LE management and use of UEs to self assist  Ambulation/Gait Ambulation/Gait assistance: Min assist Ambulation Distance (Feet): 50 Feet Assistive device: Rolling walker (2 wheeled) Gait Pattern/deviations: Step-to pattern;Decreased step length - right;Decreased step length - left;Shuffle;Trunk flexed Gait velocity: decr Gait velocity interpretation: Below normal speed for age/gender General Gait Details: cues for sequence, posture and position from [employer]            Wheelchair Mobility    Modified Rankin (Stroke Patients Only)       Balance                                             Pertinent Vitals/Pain Pain Assessment: 0-10 Pain Score: 4  Pain Location: R hip Pain Descriptors / Indicators: Aching;Sore Pain  Intervention(s): Limited activity within patient's tolerance;Monitored during session;Premedicated before session;Ice applied    Home Living Family/patient expects to be discharged to:: Private residence Living Arrangements: Children Available Help at Discharge: Family Type of Home: House Home Access: Stairs to enter;Ramped entrance   Entrance Stairs-Number of Steps: 1 Home Layout: One level Home Equipment: Environmental consultant - 4 wheels;Cane - single point      Prior Function Level of Independence: Independent with assistive device(s)         Comments: Rollator vs cane for ambulation     Hand Dominance        Extremity/Trunk Assessment   Upper Extremity Assessment Upper Extremity Assessment: Overall WFL for tasks assessed    Lower Extremity Assessment Lower Extremity Assessment: RLE deficits/detail    Cervical / Trunk Assessment Cervical / Trunk Assessment: Normal  Communication   Communication: No difficulties  Cognition Arousal/Alertness: Awake/alert Behavior During Therapy: WFL for tasks assessed/performed Overall Cognitive Status: Within Functional Limits for tasks assessed                                        General Comments      Exercises     Assessment/Plan    PT Assessment Patient needs continued PT services  PT Problem List Decreased strength;Decreased range of motion;Decreased activity tolerance;Decreased  balance;Decreased mobility;Decreased knowledge of use of DME;Pain       PT Treatment Interventions DME instruction;Gait training;Stair training;Functional mobility training;Therapeutic activities;Therapeutic exercise;Patient/family education    PT Goals (Current goals can be found in the Care Plan section)  Acute Rehab PT Goals Patient Stated Goal: get back to dancing PT Goal Formulation: With patient Time For Goal Achievement: 11/25/16 Potential to Achieve Goals: Good    Frequency 7X/week   Barriers to discharge         Co-evaluation               End of Session Equipment Utilized During Treatment: Gait belt Activity Tolerance: Patient tolerated treatment well Patient left: in chair;with call bell/phone within reach;with family/visitor present Nurse Communication: Mobility status PT Visit Diagnosis: Difficulty in walking, not elsewhere classified (R26.2)    Time: 7001-7494 PT Time Calculation (min) (ACUTE ONLY): 28 min   Charges:   PT Evaluation $PT Eval Low Complexity: 1 Procedure PT Treatments $Gait Training: 8-22 mins   PT G Codes:          Jathniel Smeltzer 11/22/2016, 5:53 PM

## 2016-11-22 NOTE — Transfer of Care (Signed)
Immediate Anesthesia Transfer of Care Note  Patient: Natalie Tran  Procedure(s) Performed: Procedure(s): RIGHT TOTAL HIP ARTHROPLASTY ANTERIOR APPROACH (Right)  Patient Location: PACU  Anesthesia Type:General and Spinal  Level of Consciousness: awake, alert , oriented and patient cooperative  Airway & Oxygen Therapy: Patient Spontanous Breathing and Patient connected to face mask oxygen  Post-op Assessment: Report given to RN and Post -op Vital signs reviewed and stable  Post vital signs: Reviewed and stable  Last Vitals:  Vitals:   11/22/16 0545  BP: (!) 138/91  Pulse: 85  Temp: 36.9 C    Last Pain:  Vitals:   11/22/16 0545  TempSrc: Oral      Patients Stated Pain Goal: 4 (48/83/01 4159)  Complications: No apparent anesthesia complications

## 2016-11-22 NOTE — Op Note (Signed)
OPERATIVE REPORT- TOTAL HIP ARTHROPLASTY   PREOPERATIVE DIAGNOSIS: Osteoarthritis of the Right hip.   POSTOPERATIVE DIAGNOSIS: Osteoarthritis of the Right  hip.   PROCEDURE: Right total hip arthroplasty, anterior approach.   SURGEON: Gaynelle Arabian, MD   ASSISTANT: Arlee Muslim, PA-C  ANESTHESIA:  General  ESTIMATED BLOOD LOSS:-250 ml   DRAINS: Hemovac x1.   COMPLICATIONS: None   CONDITION: PACU - hemodynamically stable.   BRIEF CLINICAL NOTE: Natalie Tran is a 76 y.o. female who has advanced end-  stage arthritis of their Right  hip with progressively worsening pain and  dysfunction.The patient has failed nonoperative management and presents for  total hip arthroplasty.   PROCEDURE IN DETAIL: After successful administration of spinal  anesthetic, the traction boots for the [facility] bed were placed on both  feet and the patient was placed onto the [facility] bed, boots placed into the leg  holders. The Right hip was then isolated from the perineum with plastic  drapes and prepped and draped in the usual sterile fashion. ASIS and  greater trochanter were marked and a oblique incision was made, starting  at about 1 cm lateral and 2 cm distal to the ASIS and coursing towards  the anterior cortex of the femur. The skin was cut with a 10 blade  through subcutaneous tissue to the level of the fascia overlying the  tensor fascia lata muscle. The fascia was then incised in line with the  incision at the junction of the anterior third and posterior 2/3rd. The  muscle was teased off the fascia and then the interval between the TFL  and the rectus was developed. The Hohmann retractor was then placed at  the top of the femoral neck over the capsule. The vessels overlying the  capsule were cauterized and the fat on top of the capsule was removed.  A Hohmann retractor was then placed anterior underneath the rectus  femoris to give exposure to the entire anterior capsule. A T-shaped   capsulotomy was performed. The edges were tagged and the femoral head  was identified.       Osteophytes are removed off the superior acetabulum.  The femoral neck was then cut in situ with an oscillating saw. Traction  was then applied to the left lower extremity utilizing the [facility]  traction. The femoral head was then removed. Retractors were placed  around the acetabulum and then circumferential removal of the labrum was  performed. Osteophytes were also removed. Reaming starts at 45 mm to  medialize and  Increased in 2 mm increments to 47 mm. We reamed in  approximately 40 degrees of abduction, 20 degrees anteversion. A 48 mm  pinnacle acetabular shell was then impacted in anatomic position under  fluoroscopic guidance with excellent purchase. We did not need to place  any additional dome screws. A 28 mm neutral + 4 marathon liner was then  placed into the acetabular shell.       The femoral lift was then placed along the lateral aspect of the femur  just distal to the vastus ridge. The leg was  externally rotated and capsule  was stripped off the inferior aspect of the femoral neck down to the  level of the lesser trochanter, this was done with electrocautery. The femur was lifted after this was performed. The  leg was then placed in an extended and adducted position essentially delivering the femur. We also removed the capsule superiorly and the piriformis from the piriformis fossa to  gain excellent exposure of the  proximal femur. Rongeur was used to remove some cancellous bone to get  into the lateral portion of the proximal femur for placement of the  initial starter reamer. The starter broaches was placed  the starter broach  and was shown to go down the center of the canal. Broaching  with the  Corail system was then performed starting at size 8, coursing  Up to size 13. A size 13 had excellent torsional and rotational  and axial stability. The trial high offset neck was then  placed  with a 28 + 1.5 trial head. The hip was then reduced. We confirmed that  the stem was in the canal both on AP and lateral x-rays. It also has excellent sizing. The hip was reduced with outstanding stability through full extension and full external rotation.. AP pelvis was taken and the leg lengths were measured and found to be equal. Hip was then dislocated again and the femoral head and neck removed. The  femoral broach was removed. Size 13 Corail stem with a high offset  neck was then impacted into the femur following native anteversion. Has  excellent purchase in the canal. Excellent torsional and rotational and  axial stability. It is confirmed to be in the canal on AP and lateral  fluoroscopic views. The 28 + 1.5 ceramic head was placed and the hip  reduced with outstanding stability. Again AP pelvis was taken and it  confirmed that the leg lengths were equal. The wound was then copiously  irrigated with saline solution and the capsule reattached and repaired  with Ethibond suture. 30 ml of .25% Bupivicaine was  injected into the capsule and into the edge of the tensor fascia lata as well as subcutaneous tissue. The fascia overlying the tensor fascia lata was then closed with a running #1 V-Loc. Subcu was closed with interrupted 2-0 Vicryl and subcuticular running 4-0 Monocryl. Incision was cleaned  and dried. Steri-Strips and a bulky sterile dressing applied. Hemovac  drain was hooked to suction and then the patient was awakened and transported to  recovery in stable condition.        Please note that a surgical assistant was a medical necessity for this procedure to perform it in a safe and expeditious manner. Assistant was necessary to provide appropriate retraction of vital neurovascular structures and to prevent femoral fracture and allow for anatomic placement of the prosthesis.  Gaynelle Arabian, M.D.

## 2016-11-23 LAB — BASIC METABOLIC PANEL
Anion gap: 5 (ref 5–15)
BUN: 10 mg/dL (ref 6–20)
CHLORIDE: 109 mmol/L (ref 101–111)
CO2: 28 mmol/L (ref 22–32)
Calcium: 8.9 mg/dL (ref 8.9–10.3)
Creatinine, Ser: 0.65 mg/dL (ref 0.44–1.00)
Glucose, Bld: 114 mg/dL — ABNORMAL HIGH (ref 65–99)
Potassium: 3.6 mmol/L (ref 3.5–5.1)
SODIUM: 142 mmol/L (ref 135–145)

## 2016-11-23 LAB — CBC
HCT: 31.2 % — ABNORMAL LOW (ref 36.0–46.0)
HEMOGLOBIN: 10.3 g/dL — AB (ref 12.0–15.0)
MCH: 31.1 pg (ref 26.0–34.0)
MCHC: 33 g/dL (ref 30.0–36.0)
MCV: 94.3 fL (ref 78.0–100.0)
PLATELETS: 160 10*3/uL (ref 150–400)
RBC: 3.31 MIL/uL — AB (ref 3.87–5.11)
RDW: 14.2 % (ref 11.5–15.5)
WBC: 9.7 10*3/uL (ref 4.0–10.5)

## 2016-11-23 MED ORDER — TRAMADOL HCL 50 MG PO TABS: 50.0000 mg | ORAL_TABLET | Freq: Four times a day (QID) | ORAL | 0 refills | Status: DC | PRN

## 2016-11-23 MED ORDER — SODIUM CHLORIDE 0.9 % IV BOLUS (SEPSIS): 250.0000 mL | Freq: Once | INTRAVENOUS | Status: DC

## 2016-11-23 MED ORDER — METHOCARBAMOL 500 MG PO TABS: 500.0000 mg | ORAL_TABLET | Freq: Four times a day (QID) | ORAL | 0 refills | Status: DC | PRN

## 2016-11-23 MED ORDER — RIVAROXABAN 10 MG PO TABS: 10.0000 mg | ORAL_TABLET | Freq: Every day | ORAL | 0 refills | Status: DC

## 2016-11-23 MED ORDER — OXYCODONE HCL 5 MG PO TABS: 5.0000 mg | ORAL_TABLET | ORAL | 0 refills | Status: DC | PRN

## 2016-11-23 NOTE — Progress Notes (Signed)
qPhysical Therapy Treatment Patient Details Name: Natalie Tran MRN: [id number] DOB: 01-08-1941 Today's Date: 11/23/2016    History of Present Illness Pt s/p R THR and     PT Comments    Pt moving slowly but progressing well with mobility.  Granddaughter present and reviewed bed mobility, toilet transfers, shower transfers, car transfers and stairs.   Follow Up Recommendations  Home health PT     Equipment Recommendations  None recommended by PT    Recommendations for Other Services OT consult     Precautions / Restrictions Precautions Precautions: Fall Restrictions Weight Bearing Restrictions: No Other Position/Activity Restrictions: WBAT    Mobility  Bed Mobility Overal bed mobility: Needs Assistance Bed Mobility: Supine to Sit;Sit to Supine     Supine to sit: Min guard Sit to supine: Min assist   General bed mobility comments: cues for sequence and use of L LE to self assist.    Transfers Overall transfer level: Needs assistance Equipment used: Rolling walker (2 wheeled) Transfers: Sit to/from Stand Sit to Stand: Min guard;Supervision         General transfer comment: cues for LE management and use of UEs to self assist  Ambulation/Gait Ambulation/Gait assistance: Min guard;Supervision Ambulation Distance (Feet): 70 Feet Assistive device: Rolling walker (2 wheeled) Gait Pattern/deviations: Step-to pattern;Decreased step length - right;Decreased step length - left;Shuffle;Trunk flexed Gait velocity: decr Gait velocity interpretation: Below normal speed for age/gender General Gait Details: cues for sequence, posture and position from RW   Stairs Stairs: Yes   Stair Management: No rails;Step to pattern;Forwards;With walker Number of Stairs: 4 General stair comments: single step twice and 2 step with RW and cues for foot/RW placement  Wheelchair Mobility    Modified Rankin (Stroke Patients Only)       Balance                                             Cognition Arousal/Alertness: Awake/alert Behavior During Therapy: WFL for tasks assessed/performed Overall Cognitive Status: Within Functional Limits for tasks assessed                                        Exercises Total Joint Exercises Ankle Circles/Pumps: AROM;Both;20 reps;Supine Quad Sets: AROM;Both;10 reps;Supine Heel Slides: AAROM;Right;20 reps;Supine Hip ABduction/ADduction: AAROM;Right;15 reps;Supine    General Comments        Pertinent Vitals/Pain Pain Assessment: 0-10 Pain Score: 3  Pain Location: R hip Pain Descriptors / Indicators: Aching;Sore Pain Intervention(s): Limited activity within patient's tolerance;Monitored during session;Premedicated before session;Ice applied    Home Living                      Prior Function            PT Goals (current goals can now be found in the care plan section) Acute Rehab PT Goals Patient Stated Goal: get back to dancing PT Goal Formulation: With patient Time For Goal Achievement: 11/25/16 Potential to Achieve Goals: Good Progress towards PT goals: Progressing toward goals    Frequency    7X/week      PT Plan Current plan remains appropriate    Co-evaluation             End of Session Equipment Utilized During Treatment: Gait belt  Activity Tolerance: Patient tolerated treatment well Patient left: with call bell/phone within reach;with family/visitor present;in bed Nurse Communication: Mobility status PT Visit Diagnosis: Difficulty in walking, not elsewhere classified (R26.2)     Time: 9675-9163 PT Time Calculation (min) (ACUTE ONLY): 45 min  Charges:  $Gait Training: 23-37 mins $Therapeutic Exercise: 8-22 mins $Therapeutic Activity: 8-22 mins                    G Codes:          Natalie Tran 11/23/2016, 3:28 PM

## 2016-11-23 NOTE — Care Management Note (Signed)
Case Management Note  Patient Details  Name: Natalie Tran MRN: [id number] Date of Birth: [birth year]  Subjective/Objective: 76 y.o. 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 PT has recommended HHPT and pt has chosen [facility] to provide this care at discharge. Tim, the representative for [facility] is aware. No DME needs as pt has both RW and 3n1 at home. [name] daughter is to assist pt at home in the immediate post op period.                    Action/Plan: Anticipate discharge home today. No further CM needs but will be available should additional discharge needs arise.   Expected Discharge Date:  11/23/16               Expected Discharge Plan:  [place]  In-House Referral:  NA  Discharge planning Services  CM Consult  Post Acute Care Choice:  Home Health Choice offered to:  Patient [name] Daughter at bedside)  DME Arranged:   (Has RW and 3n1 at home ) DME Agency:  NA  HH Arranged:  PT [place] Agency:  [facility] (now [facility])  Status of Service:  Completed, signed off  If discussed at [employer] of Stay Meetings, dates discussed:    Additional Comments:  Delrae Sawyers, RN 11/23/2016, 8:49 AM

## 2016-11-23 NOTE — Discharge Instructions (Addendum)
° °Dr. Frank Aluisio °Total Joint Specialist °[facility] °[address] °[place], [place] [address] °[phone] ° °ANTERIOR APPROACH TOTAL HIP REPLACEMENT POSTOPERATIVE DIRECTIONS ° ° °Hip Rehabilitation, Guidelines Following Surgery  °The results of a hip operation are greatly improved after range of motion and muscle strengthening exercises. Follow all safety measures which are given to protect your hip. If any of these exercises cause increased pain or swelling in your joint, decrease the amount until you are comfortable again. Then slowly increase the exercises. Call your caregiver if you have problems or questions.  ° °HOME CARE INSTRUCTIONS  °Remove items at home which could result in a fall. This includes throw rugs or furniture in walking pathways.  °· ICE to the affected hip every three hours for 30 minutes at a time and then as needed for pain and swelling.  Continue to use ice on the hip for pain and swelling from surgery. You may notice swelling that will progress down to the foot and ankle.  This is normal after surgery.  Elevate the leg when you are not up walking on it.   °· Continue to use the breathing machine which will help keep your temperature down.  It is common for your temperature to cycle up and down following surgery, especially at night when you are not up moving around and exerting yourself.  The breathing machine keeps your lungs expanded and your temperature down. ° ° °DIET °You may resume your previous home diet once your are discharged from the hospital. ° °DRESSING / WOUND CARE / SHOWERING °You may shower 3 days after surgery, but keep the wounds dry during showering.  You may use an occlusive plastic wrap (Press'n Seal for example), NO SOAKING/SUBMERGING IN THE BATHTUB.  If the bandage gets wet, change with a clean dry gauze.  If the incision gets wet, pat the wound dry with a clean towel. °You may start showering once you are discharged home but do not  submerge the incision under water. Just pat the incision dry and apply a dry gauze dressing on daily. °Change the surgical dressing daily and reapply a dry dressing each time. ° °ACTIVITY °Walk with your walker as instructed. °Use walker as long as suggested by your caregivers. °Avoid periods of inactivity such as sitting longer than an hour when not asleep. This helps prevent blood clots.  °You may resume a sexual relationship in one month or when given the OK by your doctor.  °You may return to work once you are cleared by your doctor.  °Do not drive a car for 6 weeks or until released by you surgeon.  °Do not drive while taking narcotics. ° °WEIGHT BEARING °Weight bearing as tolerated with assist device (walker, cane, etc) as directed, use it as long as suggested by your surgeon or therapist, typically at least 4-6 weeks. ° °POSTOPERATIVE CONSTIPATION PROTOCOL °Constipation - defined medically as fewer than three stools per week and severe constipation as less than one stool per week. ° °One of the most common issues patients have following surgery is constipation.  Even if you have a regular bowel pattern at home, your normal regimen is likely to be disrupted due to multiple reasons following surgery.  Combination of anesthesia, postoperative narcotics, change in appetite and fluid intake all can affect your bowels.  In order to avoid complications following surgery, here are some recommendations in order to help you during your recovery period. ° °Colace (docusate) - Pick up an over-the-counter   form of Colace or another stool softener and take twice a day as long as you are requiring postoperative pain medications.  Take with a full glass of water daily.  If you experience loose stools or diarrhea, hold the colace until you stool forms back up.  If your symptoms do not get better within 1 week or if they get worse, check with your doctor. ° °Dulcolax (bisacodyl) - Pick up over-the-counter and take as directed  by the product packaging as needed to assist with the movement of your bowels.  Take with a full glass of water.  Use this product as needed if not relieved by Colace only.  ° °MiraLax (polyethylene glycol) - Pick up over-the-counter to have on hand.  MiraLax is a solution that will increase the amount of water in your bowels to assist with bowel movements.  Take as directed and can mix with a glass of water, juice, soda, coffee, or tea.  Take if you go more than two days without a movement. °Do not use MiraLax more than once per day. Call your doctor if you are still constipated or irregular after using this medication for 7 days in a row. ° °If you continue to have problems with postoperative constipation, please contact the office for further assistance and recommendations.  If you experience "the worst abdominal pain ever" or develop nausea or vomiting, please contact the office immediatly for further recommendations for treatment. ° °ITCHING ° If you experience itching with your medications, try taking only a single pain pill, or even half a pain pill at a time.  You can also use Benadryl over the counter for itching or also to help with sleep.  ° °TED HOSE STOCKINGS °Wear the elastic stockings on both legs for three weeks following surgery during the day but you may remove then at night for sleeping. ° °MEDICATIONS °See your medication summary on the “After Visit Summary” that the nursing staff will review with you prior to discharge.  You may have some home medications which will be placed on hold until you complete the course of blood thinner medication.  It is important for you to complete the blood thinner medication as prescribed by your surgeon.  Continue your approved medications as instructed at time of discharge. ° °PRECAUTIONS °If you experience chest pain or shortness of breath - call 911 immediately for transfer to the hospital emergency department.  °If you develop a fever greater that 101 F,  purulent drainage from wound, increased redness or drainage from wound, foul odor from the wound/dressing, or calf pain - CONTACT YOUR SURGEON.   °                                                °FOLLOW-UP APPOINTMENTS °Make sure you keep all of your appointments after your operation with your surgeon and caregivers. You should call the office at the above phone number and make an appointment for approximately two weeks after the date of your surgery or on the date instructed by your surgeon outlined in the "After Visit Summary". ° °RANGE OF MOTION AND STRENGTHENING EXERCISES  °These exercises are designed to help you keep full movement of your hip joint. Follow your caregiver's or physical therapist's instructions. Perform all exercises about fifteen times, three times per day or as directed. Exercise both hips, even if you   have had only one joint replacement. These exercises can be done on a training (exercise) mat, on the floor, on a table or on a bed. Use whatever works the best and is most comfortable for you. Use music or television while you are exercising so that the exercises are a pleasant break in your day. This will make your life better with the exercises acting as a break in routine you can look forward to.  Lying on your back, slowly slide your foot toward your buttocks, raising your knee up off the floor. Then slowly slide your foot back down until your leg is straight again.  Lying on your back spread your legs as far apart as you can without causing discomfort.  Lying on your side, raise your upper leg and foot straight up from the floor as far as is comfortable. Slowly lower the leg and repeat.  Lying on your back, tighten up the muscle in the front of your thigh (quadriceps muscles). You can do this by keeping your leg straight and trying to raise your heel off the floor. This helps strengthen the largest muscle supporting your knee.  Lying on your back, tighten up the muscles of your  buttocks both with the legs straight and with the knee bent at a comfortable angle while keeping your heel on the floor.   IF YOU ARE TRANSFERRED TO A SKILLED REHAB FACILITY If the patient is transferred to a skilled rehab facility following release from the hospital, a list of the current medications will be sent to the facility for the patient to continue.  When discharged from the skilled rehab facility, please have the facility set up the patient's [place] prior to being released. Also, the skilled facility will be responsible for providing the patient with their medications at time of release from the facility to include their pain medication, the muscle relaxants, and their blood thinner medication. If the patient is still at the rehab facility at time of the two week follow up appointment, the skilled rehab facility will also need to assist the patient in arranging follow up appointment in our office and any transportation needs.  MAKE SURE YOU:  Understand these instructions.  Get help right away if you are not doing well or get worse.    Pick up stool softner and laxative for home use following surgery while on pain medications. Do not submerge incision under water. Please use good hand washing techniques while changing dressing each day. May shower starting three days after surgery. Please use a clean towel to pat the incision dry following showers. Continue to use ice for pain and swelling after surgery. Do not use any lotions or creams on the incision until instructed by your surgeon.  Take Xarelto for two and a half more weeks following discharge from the hospital, then discontinue Xarelto. Once the patient has completed the blood thinner regimen, then take a Baby 81 mg Aspirin daily for three more weeks.    Information on my medicine - XARELTO (Rivaroxaban)  This medication education was reviewed with me or my healthcare representative as part of my  discharge preparation.  The pharmacist that spoke with me during my hospital stay was:  Tommie Raymond, Student-PharmD  Why was Xarelto prescribed for you? Xarelto was prescribed for you to reduce the risk of blood clots forming after orthopedic surgery. The medical term for these abnormal blood clots is venous thromboembolism (VTE).  What do you need to know  Take your Xarelto® ONCE DAILY at the same time every day. °You may take it either with or without food. ° °If you have difficulty swallowing the tablet whole, you may crush it and mix in applesauce just prior to taking your dose. ° °Take Xarelto® exactly as prescribed by your doctor and DO NOT stop taking Xarelto® without talking to the doctor who prescribed the medication.  Stopping without other VTE prevention medication to take the place of Xarelto® may increase your risk of developing a clot. ° °After discharge, you should have regular check-up appointments with your healthcare provider that is prescribing your Xarelto®.   ° °What do you do if you miss a dose? °If you miss a dose, take it as soon as you remember on the same day then continue your regularly scheduled once daily regimen the next day. Do not take two doses of Xarelto® on the same day.  ° °Important Safety Information °A possible side effect of Xarelto® is bleeding. You should call your healthcare provider right away if you experience any of the following: °? Bleeding from an injury or your nose that does not stop. °? Unusual colored urine (red or dark brown) or unusual colored stools (red or black). °? Unusual bruising for unknown reasons. °? A serious fall or if you hit your head (even if there is no bleeding). ° °Some medicines may interact with Xarelto® and might increase your risk of bleeding while on Xarelto®. To help avoid this, consult your healthcare provider or pharmacist prior to using any new prescription or non-prescription medications, including herbals, vitamins,  non-steroidal anti-inflammatory drugs (NSAIDs) and supplements. ° °This website has more information on Xarelto®: www.xarelto.com. ° ° ° ° °

## 2016-11-23 NOTE — Progress Notes (Signed)
qPhysical Therapy Treatment Patient Details Name: Natalie Tran MRN: [id number] DOB: [birth year] Today's Date: 11/23/2016    History of Present Illness Pt s/p R THR and     PT Comments    Pt progressing steadily with mobility and hopeful for dc home this pm.   Follow Up Recommendations  Home health PT     Equipment Recommendations  None recommended by PT    Recommendations for Other Services OT consult     Precautions / Restrictions Precautions Precautions: Fall Restrictions Weight Bearing Restrictions: No Other Position/Activity Restrictions: WBAT    Mobility  Bed Mobility Overal bed mobility: Needs Assistance Bed Mobility: Supine to Sit     Supine to sit: Min assist     General bed mobility comments: cues for sequence and use of L LE to self assist.    Transfers Overall transfer level: Needs assistance Equipment used: Rolling walker (2 wheeled) Transfers: Sit to/from Stand Sit to Stand: Min assist;Min guard         General transfer comment: cues for LE management and use of UEs to self assist  Ambulation/Gait Ambulation/Gait assistance: Min assist;Min guard Ambulation Distance (Feet): 130 Feet (and 15' to bathroom) Assistive device: Rolling walker (2 wheeled) Gait Pattern/deviations: Step-to pattern;Decreased step length - right;Decreased step length - left;Shuffle;Trunk flexed Gait velocity: decr   General Gait Details: cues for sequence, posture and position from [employer]            Wheelchair Mobility    Modified Rankin (Stroke Patients Only)       Balance                                            Cognition Arousal/Alertness: Awake/alert Behavior During Therapy: WFL for tasks assessed/performed Overall Cognitive Status: Within Functional Limits for tasks assessed                                        Exercises Total Joint Exercises Ankle Circles/Pumps: AROM;Both;20 reps;Supine Quad  Sets: AROM;Both;10 reps;Supine Heel Slides: AAROM;Right;20 reps;Supine Hip ABduction/ADduction: AAROM;Right;15 reps;Supine    General Comments        Pertinent Vitals/Pain Pain Assessment: 0-10 Pain Score: 3  Pain Location: R hip Pain Descriptors / Indicators: Aching;Sore Pain Intervention(s): Limited activity within patient's tolerance;Monitored during session;Premedicated before session;Ice applied    Home Living                      Prior Function            PT Goals (current goals can now be found in the care plan section) Acute Rehab PT Goals Patient Stated Goal: get back to dancing PT Goal Formulation: With patient Time For Goal Achievement: 11/25/16 Potential to Achieve Goals: Good Progress towards PT goals: Progressing toward goals    Frequency    7X/week      PT Plan Current plan remains appropriate    Co-evaluation             End of Session Equipment Utilized During Treatment: Gait belt Activity Tolerance: Patient tolerated treatment well Patient left: in chair;with call bell/phone within reach;with family/visitor present Nurse Communication: Mobility status PT Visit Diagnosis: Difficulty in walking, not elsewhere classified (R26.2)     Time: 6314-9702 PT  Time Calculation (min) (ACUTE ONLY): 39 min  Charges:  $Gait Training: 8-22 mins $Therapeutic Exercise: 8-22 mins $Therapeutic Activity: 8-22 mins                    G Codes:        Natalie Tran Dec 14, 2016, 1:21 PM

## 2016-11-23 NOTE — Discharge Summary (Signed)
Physician Discharge Summary   Patient ID: Natalie Tran MRN: [id number] DOB/AGE: 1940/11/16 76 y.o.  Admit date: 11/22/2016 Discharge date: 11-23-2016  Primary Diagnosis:  Osteoarthritis of the Right hip.   Admission Diagnoses:  Past Medical History:  Diagnosis Date  . 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 Arthritis   . Cancer ([place])    hx of skin cancer right cheek   Discharge Diagnoses:   Principal Problem:   OA (osteoarthritis) of hip  Estimated body mass index is 21.46 kg/m as calculated from the following:   Height as of this encounter: _0  (1.626 m).   Weight as of this encounter: 56.7 kg (125 lb).  Procedure(s) (LRB): RIGHT TOTAL HIP ARTHROPLASTY ANTERIOR APPROACH (Right)   Consults: None  HPI: Natalie Tran is a 76 y.o. female who has advanced end-  stage arthritis of their Right  hip with progressively worsening pain and  dysfunction.The patient has failed nonoperative management and presents for  total hip arthroplasty.   Laboratory Data: Admission on 11/22/2016  Component Date Value Ref Range Status  . WBC 11/23/2016 9.7  4.0 - 10.5 K/uL Final  . RBC 11/23/2016 3.31* 3.87 - 5.11 MIL/uL Final  . Hemoglobin 11/23/2016 10.3* 12.0 - 15.0 g/dL Final  . HCT 11/23/2016 31.2* 36.0 - 46.0 % Final  . MCV 11/23/2016 94.3  78.0 - 100.0 fL Final  . MCH 11/23/2016 31.1  26.0 - 34.0 pg Final  . MCHC 11/23/2016 33.0  30.0 - 36.0 g/dL Final  . RDW 11/23/2016 14.2  11.5 - 15.5 % Final  . Platelets 11/23/2016 160  150 - 400 K/uL Final  . Sodium 11/23/2016 142  135 - 145 mmol/L Final  . Potassium 11/23/2016 3.6  3.5 - 5.1 mmol/L Final  . Chloride 11/23/2016 109  101 - 111 mmol/L Final  . CO2 11/23/2016 28  22 - 32 mmol/L Final  . Glucose, Bld 11/23/2016 114* 65 - 99 mg/dL Final  . BUN 11/23/2016 10  6 - 20 mg/dL Final  . Creatinine, Ser 11/23/2016 0.65  0.44 - 1.00 mg/dL Final  . Calcium 11/23/2016 8.9  8.9 - 10.3 mg/dL Final  . GFR calc non Af Amer 11/23/2016 >60   >60 mL/min Final  . GFR calc Af Amer 11/23/2016 >60  >60 mL/min Final   Comment: (NOTE) The eGFR has been calculated using the CKD EPI equation. This calculation has not been validated in all clinical situations. eGFR's persistently <60 mL/min signify possible Chronic Kidney Disease.   Georgiann Hahn gap 11/23/2016 5  5 - 15 Final  Hospital Outpatient Visit on 11/15/2016  Component Date Value Ref Range Status  . aPTT 11/15/2016 30  24 - 36 seconds Final  . WBC 11/15/2016 7.6  4.0 - 10.5 K/uL Final  . RBC 11/15/2016 4.37  3.87 - 5.11 MIL/uL Final  . Hemoglobin 11/15/2016 13.3  12.0 - 15.0 g/dL Final  . HCT 11/15/2016 40.7  36.0 - 46.0 % Final  . MCV 11/15/2016 93.1  78.0 - 100.0 fL Final  . MCH 11/15/2016 30.4  26.0 - 34.0 pg Final  . MCHC 11/15/2016 32.7  30.0 - 36.0 g/dL Final  . RDW 11/15/2016 14.0  11.5 - 15.5 % Final  . Platelets 11/15/2016 195  150 - 400 K/uL Final  . Sodium 11/15/2016 140  135 - 145 mmol/L Final  . Potassium 11/15/2016 4.8  3.5 - 5.1 mmol/L Final  . Chloride 11/15/2016 103  101 - 111 mmol/L Final  .  CO2 11/15/2016 28  22 - 32 mmol/L Final  . Glucose, Bld 11/15/2016 96  65 - 99 mg/dL Final  . BUN 11/15/2016 17  6 - 20 mg/dL Final  . Creatinine, Ser 11/15/2016 0.73  0.44 - 1.00 mg/dL Final  . Calcium 11/15/2016 10.3  8.9 - 10.3 mg/dL Final  . Total Protein 11/15/2016 8.1  6.5 - 8.1 g/dL Final  . Albumin 11/15/2016 4.3  3.5 - 5.0 g/dL Final  . AST 11/15/2016 23  15 - 41 U/L Final  . ALT 11/15/2016 16  14 - 54 U/L Final  . Alkaline Phosphatase 11/15/2016 81  38 - 126 U/L Final  . Total Bilirubin 11/15/2016 0.5  0.3 - 1.2 mg/dL Final  . GFR calc non Af Amer 11/15/2016 >60  >60 mL/min Final  . GFR calc Af Amer 11/15/2016 >60  >60 mL/min Final   Comment: (NOTE) The eGFR has been calculated using the CKD EPI equation. This calculation has not been validated in all clinical situations. eGFR's persistently <60 mL/min signify possible Chronic Kidney Disease.   . Anion  gap 11/15/2016 9  5 - 15 Final  . Prothrombin Time 11/15/2016 12.8  11.4 - 15.2 seconds Final  . INR 11/15/2016 0.96   Final  . ABO/RH(D) 11/15/2016 O POS   Final  . Antibody Screen 11/15/2016 NEG   Final  . Sample Expiration 11/15/2016 11/25/2016   Final  . Extend sample reason 11/15/2016 NO TRANSFUSIONS OR PREGNANCY IN THE PAST 3 MONTHS   Final  . MRSA, PCR 11/15/2016 INVALID RESULTS, SPECIMEN SENT FOR CULTURE* NEGATIVE Final  . Staphylococcus aureus 11/15/2016 INVALID RESULTS, SPECIMEN SENT FOR CULTURE* NEGATIVE Final   Comment: AFTERHOURS        The Xpert SA Assay (FDA approved for NASAL specimens in patients over 71 years of age), is one component of a comprehensive surveillance program.  Test performance has been validated by [facility] for patients greater than or equal to 74 year old. It is not intended to diagnose infection nor to guide or monitor treatment.   . ABO/RH(D) 11/15/2016 O POS   Final  . Specimen Description 11/15/2016 NOSE   Final  . Special Requests 11/15/2016 NONE   Final  . Culture 11/15/2016    Final                   Value:NO MRSA DETECTED Performed at [facility], [place] [address], [place], [place] [address]   . Report Status 11/15/2016 11/18/2016 FINAL   Final     X-Rays:Dg Pelvis Portable  Result Date: 11/22/2016 CLINICAL DATA:  Arthroplasty. EXAM: PORTABLE PELVIS 1-2 VIEWS COMPARISON:  No recent . FINDINGS: Total right hip replacement. Hardware intact. Anatomic alignment . No acute bony abnormality identified. Severe degenerative changes left hip. Subchondral cystic changes in the femoral head are most likely degenerative. IMPRESSION: 1. Total right hip replacement. Hardware intact. Anatomic alignment . 2.  Severe degenerative changes left hip . Electronically Signed   By: Marcello Moores  Register   On: 11/22/2016 09:37   Dg C-arm 1-60 Min-no Report  Result Date: 11/22/2016 Fluoroscopy was utilized by the requesting physician.  No radiographic  interpretation.    EKG:No orders found for this or any previous visit.   Hospital Course: Patient was admitted to [facility] and taken to the OR and underwent the above state procedure without complications.  Patient tolerated the procedure well and was later transferred to the recovery room and then to the orthopaedic  floor for postoperative care.  They were given PO and IV analgesics for pain control following their surgery.  They were given 24 hours of postoperative antibiotics of  Anti-infectives    Start     Dose/Rate Route Frequency Ordered Stop   11/22/16 1330  ceFAZolin (ANCEF) IVPB 2g/100 mL premix     2 g 200 mL/hr over 30 Minutes Intravenous Every 6 hours 11/22/16 1035 11/22/16 1908   11/22/16 0607  ceFAZolin (ANCEF) 2-4 GM/100ML-% IVPB    Comments:  Harvell, Gwendolyn  : cabinet override      11/22/16 0607 11/22/16 0730   11/22/16 0534  ceFAZolin (ANCEF) IVPB 2g/100 mL premix     2 g 200 mL/hr over 30 Minutes Intravenous On call to O.R. 11/22/16 0534 11/22/16 0730     and started on DVT prophylaxis in the form of Xarelto.   PT and OT were ordered for total hip protocol.  The patient was allowed to be WBAT with therapy. Discharge planning was consulted to help with postop disposition and equipment needs.  Patient had a decent night on the evening of surgery.  They started to get up OOB with therapy on day one.  Hemovac drain was pulled without difficulty.  Patient was seen in rounds on day one.  The patient wanted to go home so arrangements were made, completed therapy and was ready to go home later that day.  Discharge home with home health Diet - Regular diet Follow up - in 2 weeks Activity - WBAT Disposition - Home Condition Upon Discharge - stable D/C Meds - See DC Summary DVT Prophylaxis - Xarelto   Discharge Instructions    Call MD / Call 911    Complete by:  As directed    If you experience chest pain or shortness of breath, CALL 911 and be transported to  the hospital emergency room.  If you develope a fever above 101 F, pus (white drainage) or increased drainage or redness at the wound, or calf pain, call your surgeon's office.   Change dressing    Complete by:  As directed    You may change your dressing dressing daily with sterile 4 x 4 inch gauze dressing and paper tape.  Do not submerge the incision under water.   Constipation Prevention    Complete by:  As directed    Drink plenty of fluids.  Prune juice may be helpful.  You may use a stool softener, such as Colace (over the counter) 100 mg twice a day.  Use MiraLax (over the counter) for constipation as needed.   Diet - low sodium heart healthy    Complete by:  As directed    Discharge instructions    Complete by:  As directed    Pick up stool softner and laxative for home use following surgery while on pain medications. Do not submerge incision under water. Please use good hand washing techniques while changing dressing each day. May shower starting three days after surgery. Please use a clean towel to pat the incision dry following showers. Continue to use ice for pain and swelling after surgery. Do not use any lotions or creams on the incision until instructed by your surgeon.  Wear both TED hose on both legs during the day every day for three weeks, but may have off at night at home.  Postoperative Constipation Protocol  Constipation - defined medically as fewer than three stools per week and severe constipation as less than one stool per  week.  One of the most common issues patients have following surgery is constipation.  Even if you have a regular bowel pattern at home, your normal regimen is likely to be disrupted due to multiple reasons following surgery.  Combination of anesthesia, postoperative narcotics, change in appetite and fluid intake all can affect your bowels.  In order to avoid complications following surgery, here are some recommendations in order to help you  during your recovery period.  Colace (docusate) - Pick up an over-the-counter form of Colace or another stool softener and take twice a day as long as you are requiring postoperative pain medications.  Take with a full glass of water daily.  If you experience loose stools or diarrhea, hold the colace until you stool forms back up.  If your symptoms do not get better within 1 week or if they get worse, check with your doctor.  Dulcolax (bisacodyl) - Pick up over-the-counter and take as directed by the product packaging as needed to assist with the movement of your bowels.  Take with a full glass of water.  Use this product as needed if not relieved by Colace only.   MiraLax (polyethylene glycol) - Pick up over-the-counter to have on hand.  MiraLax is a solution that will increase the amount of water in your bowels to assist with bowel movements.  Take as directed and can mix with a glass of water, juice, soda, coffee, or tea.  Take if you go more than two days without a movement. Do not use MiraLax more than once per day. Call your doctor if you are still constipated or irregular after using this medication for 7 days in a row.  If you continue to have problems with postoperative constipation, please contact the office for further assistance and recommendations.  If you experience "the worst abdominal pain ever" or develop nausea or vomiting, please contact the office immediatly for further recommendations for treatment.   Take Xarelto for two and a half more weeks, then discontinue Xarelto. Once the patient has completed the blood thinner regimen, then take a Baby 81 mg Aspirin daily for three more weeks.   Do not sit on low chairs, stoools or toilet seats, as it may be difficult to get up from low surfaces    Complete by:  As directed    Driving restrictions    Complete by:  As directed    No driving until released by the physician.   Increase activity slowly as tolerated    Complete by:  As  directed    Lifting restrictions    Complete by:  As directed    No lifting until released by the physician.   Patient may shower    Complete by:  As directed    You may shower without a dressing once there is no drainage.  Do not wash over the wound.  If drainage remains, do not shower until drainage stops.   TED hose    Complete by:  As directed    Use stockings (TED hose) for 3 weeks on both leg(s).  You may remove them at night for sleeping.   Weight bearing as tolerated    Complete by:  As directed    Laterality:  right   Extremity:  Lower     Allergies as of 11/23/2016      Reactions   Asa [aspirin]    Pt states "makes stomach raw"  Pt can take motrin   Sulfa Antibiotics Other (See  Comments)   unknown      Medication List    STOP taking these medications   Camphor-Menthol-Methyl Sal 1.2-5.7-6.3 % Ptch   ibuprofen 800 MG tablet Commonly known as:  ADVIL,MOTRIN   MINERAL ICE 2 % Gel Generic drug:  Menthol (Topical Analgesic)   oxyCODONE-acetaminophen 5-325 MG tablet Commonly known as:  PERCOCET/ROXICET   PROBIOTIC PO     TAKE these medications   EYE ALLERGY RELIEF OP Apply 1 drop to eye 2 (two) times daily as needed (dry eyes).   methocarbamol 500 MG tablet Commonly known as:  ROBAXIN Take 1 tablet (500 mg total) by mouth every 6 (six) hours as needed for muscle spasms.   oxyCODONE 5 MG immediate release tablet Commonly known as:  Oxy IR/ROXICODONE Take 1-2 tablets (5-10 mg total) by mouth every 4 (four) hours as needed for moderate pain or severe pain.   rivaroxaban 10 MG Tabs tablet Commonly known as:  XARELTO Take 1 tablet (10 mg total) by mouth daily with breakfast. Take Xarelto for two and a half more weeks following discharge from the hospital, then discontinue Xarelto. Once the patient has completed the blood thinner regimen, then take a Baby 81 mg Aspirin daily for three more weeks. Start taking on:  11/24/2016   traMADol 50 MG tablet Commonly  known as:  ULTRAM Take 1-2 tablets (50-100 mg total) by mouth every 6 (six) hours as needed for moderate pain.      Follow-up Information    [name], MD. Schedule an appointment as soon as possible for a visit on 12/05/2016.   Specialty:  Orthopedic Surgery Contact information: [address] [place] [address] [phone]           Signed: Arlee Muslim, PA-C Orthopaedic Surgery 11/23/2016, 8:27 AM

## 2016-11-23 NOTE — Progress Notes (Signed)
   Subjective: 1 Day Post-Op Procedure(s) (LRB): RIGHT TOTAL HIP ARTHROPLASTY ANTERIOR APPROACH (Right) Patient reports pain as mild.   Patient seen in rounds by Dr. Wynelle Link. Patient is well, but has had some minor complaints of pain in the hip, requiring pain medications We will start therapy today.  If they do well with therapy and meets all goals, then will allow home later this afternoon following therapy. Plan is to go Home after hospital stay.  Objective: Vital signs in last 24 hours: Temp:  [97.5 F (36.4 C)-98.4 F (36.9 C)] 98.4 F (36.9 C) (03/29 0503) Pulse Rate:  [70-85] 81 (03/29 0503) Resp:  [8-18] 16 (03/29 0503) BP: (100-131)/(49-77) 118/52 (03/29 0503) SpO2:  [98 %-100 %] 100 % (03/29 0503)  Intake/Output from previous day:  Intake/Output Summary (Last 24 hours) at 11/23/16 0817 Last data filed at 11/23/16 0610  Gross per 24 hour  Intake             4875 ml  Output             3065 ml  Net             1810 ml    Intake/Output this shift: No intake/output data recorded.  Labs:  Recent Labs  11/23/16 0425  HGB 10.3*    Recent Labs  11/23/16 0425  WBC 9.7  RBC 3.31*  HCT 31.2*  PLT 160    Recent Labs  11/23/16 0425  NA 142  K 3.6  CL 109  CO2 28  BUN 10  CREATININE 0.65  GLUCOSE 114*  CALCIUM 8.9   No results for input(s): LABPT, INR in the last 72 hours.  EXAM General - Patient is Alert, Appropriate and Oriented Extremity - Neurovascular intact Sensation intact distally Dressing - dressing C/D/I Motor Function - intact, moving foot and toes well on exam.  Hemovac pulled without difficulty.  Past Medical History:  Diagnosis Date  . 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 Arthritis   . Cancer ([place])    hx of skin cancer right cheek    Assessment/Plan: 1 Day Post-Op Procedure(s) (LRB): RIGHT TOTAL HIP ARTHROPLASTY ANTERIOR APPROACH (Right) Principal Problem:   OA (osteoarthritis) of hip  Estimated body mass index is  21.46 kg/m as calculated from the following:   Height as of this encounter: 5\' 4"  (1.626 m).   Weight as of this encounter: 56.7 kg (125 lb). Advance diet Up with therapy  Discharge home with home health this afternoon if meets all goals  DVT Prophylaxis - Xarelto Weight Bearing As Tolerated right Leg Hemovac Pulled Begin Therapy  If meets goals and able to go home: Up with therapy Discharge home with home health Diet - Regular diet Follow up - in 2 weeks Activity - WBAT Disposition - Home Condition Upon Discharge - pending therapy D/C Meds - See DC Summary DVT Prophylaxis - [place], PA-C Orthopaedic Surgery 11/23/2016, 8:17 AM

## 2016-11-29 ENCOUNTER — Encounter (HOSPITAL_COMMUNITY): Payer: Self-pay | Admitting: Orthopedic Surgery

## 2016-12-26 DIAGNOSIS — M1611 Unilateral primary osteoarthritis, right hip: Secondary | ICD-10-CM | POA: Diagnosis not present

## 2017-04-05 DIAGNOSIS — H1033 Unspecified acute conjunctivitis, bilateral: Secondary | ICD-10-CM | POA: Diagnosis not present

## 2017-05-09 DIAGNOSIS — H1045 Other chronic allergic conjunctivitis: Secondary | ICD-10-CM | POA: Diagnosis not present

## 2017-05-09 DIAGNOSIS — H1131 Conjunctival hemorrhage, right eye: Secondary | ICD-10-CM | POA: Diagnosis not present

## 2017-08-29 DIAGNOSIS — J0100 Acute maxillary sinusitis, unspecified: Secondary | ICD-10-CM | POA: Diagnosis not present

## 2017-08-29 DIAGNOSIS — Z6823 Body mass index (BMI) 23.0-23.9, adult: Secondary | ICD-10-CM | POA: Diagnosis not present

## 2017-10-12 DIAGNOSIS — Z471 Aftercare following joint replacement surgery: Secondary | ICD-10-CM | POA: Diagnosis not present

## 2017-10-12 DIAGNOSIS — Z96641 Presence of right artificial hip joint: Secondary | ICD-10-CM | POA: Diagnosis not present

## 2017-10-12 DIAGNOSIS — M1611 Unilateral primary osteoarthritis, right hip: Secondary | ICD-10-CM | POA: Diagnosis not present

## 2017-10-12 DIAGNOSIS — M25562 Pain in left knee: Secondary | ICD-10-CM | POA: Diagnosis not present

## 2017-10-23 DIAGNOSIS — L570 Actinic keratosis: Secondary | ICD-10-CM | POA: Diagnosis not present

## 2017-11-14 DIAGNOSIS — J189 Pneumonia, unspecified organism: Secondary | ICD-10-CM | POA: Diagnosis not present

## 2017-11-14 DIAGNOSIS — Z6822 Body mass index (BMI) 22.0-22.9, adult: Secondary | ICD-10-CM | POA: Diagnosis not present

## 2017-11-21 DIAGNOSIS — Z6822 Body mass index (BMI) 22.0-22.9, adult: Secondary | ICD-10-CM | POA: Diagnosis not present

## 2017-11-21 DIAGNOSIS — J189 Pneumonia, unspecified organism: Secondary | ICD-10-CM | POA: Diagnosis not present

## 2017-11-21 DIAGNOSIS — R05 Cough: Secondary | ICD-10-CM | POA: Diagnosis not present

## 2017-11-26 DIAGNOSIS — K2960 Other gastritis without bleeding: Secondary | ICD-10-CM | POA: Diagnosis not present

## 2017-11-26 DIAGNOSIS — E559 Vitamin D deficiency, unspecified: Secondary | ICD-10-CM | POA: Diagnosis not present

## 2017-11-26 DIAGNOSIS — I10 Essential (primary) hypertension: Secondary | ICD-10-CM | POA: Diagnosis not present

## 2017-11-26 DIAGNOSIS — M1611 Unilateral primary osteoarthritis, right hip: Secondary | ICD-10-CM | POA: Diagnosis not present

## 2017-11-28 DIAGNOSIS — Z96641 Presence of right artificial hip joint: Secondary | ICD-10-CM | POA: Diagnosis not present

## 2017-11-28 DIAGNOSIS — I10 Essential (primary) hypertension: Secondary | ICD-10-CM | POA: Diagnosis not present

## 2017-11-28 DIAGNOSIS — M47816 Spondylosis without myelopathy or radiculopathy, lumbar region: Secondary | ICD-10-CM | POA: Diagnosis not present

## 2017-11-28 DIAGNOSIS — E782 Mixed hyperlipidemia: Secondary | ICD-10-CM | POA: Diagnosis not present

## 2017-11-28 DIAGNOSIS — E559 Vitamin D deficiency, unspecified: Secondary | ICD-10-CM | POA: Diagnosis not present

## 2017-12-20 DIAGNOSIS — J302 Other seasonal allergic rhinitis: Secondary | ICD-10-CM | POA: Diagnosis not present

## 2017-12-20 DIAGNOSIS — R05 Cough: Secondary | ICD-10-CM | POA: Diagnosis not present

## 2017-12-20 DIAGNOSIS — Z6822 Body mass index (BMI) 22.0-22.9, adult: Secondary | ICD-10-CM | POA: Diagnosis not present

## 2017-12-25 DIAGNOSIS — R05 Cough: Secondary | ICD-10-CM | POA: Diagnosis not present

## 2017-12-25 DIAGNOSIS — Z6822 Body mass index (BMI) 22.0-22.9, adult: Secondary | ICD-10-CM | POA: Diagnosis not present

## 2018-04-24 DIAGNOSIS — C44319 Basal cell carcinoma of skin of other parts of face: Secondary | ICD-10-CM | POA: Diagnosis not present

## 2018-04-24 DIAGNOSIS — Z6822 Body mass index (BMI) 22.0-22.9, adult: Secondary | ICD-10-CM | POA: Diagnosis not present

## 2018-05-03 DIAGNOSIS — M5416 Radiculopathy, lumbar region: Secondary | ICD-10-CM | POA: Diagnosis not present

## 2018-05-03 DIAGNOSIS — M5137 Other intervertebral disc degeneration, lumbosacral region: Secondary | ICD-10-CM | POA: Diagnosis not present

## 2018-05-03 DIAGNOSIS — Z6822 Body mass index (BMI) 22.0-22.9, adult: Secondary | ICD-10-CM | POA: Diagnosis not present

## 2018-05-14 DIAGNOSIS — C4401 Basal cell carcinoma of skin of lip: Secondary | ICD-10-CM | POA: Diagnosis not present

## 2018-05-14 DIAGNOSIS — Z1283 Encounter for screening for malignant neoplasm of skin: Secondary | ICD-10-CM | POA: Diagnosis not present

## 2018-05-14 DIAGNOSIS — D225 Melanocytic nevi of trunk: Secondary | ICD-10-CM | POA: Diagnosis not present

## 2018-05-14 DIAGNOSIS — C44319 Basal cell carcinoma of skin of other parts of face: Secondary | ICD-10-CM | POA: Diagnosis not present

## 2018-06-05 DIAGNOSIS — Z96641 Presence of right artificial hip joint: Secondary | ICD-10-CM | POA: Diagnosis not present

## 2018-06-05 DIAGNOSIS — Z6821 Body mass index (BMI) 21.0-21.9, adult: Secondary | ICD-10-CM | POA: Diagnosis not present

## 2018-06-05 DIAGNOSIS — M47816 Spondylosis without myelopathy or radiculopathy, lumbar region: Secondary | ICD-10-CM | POA: Diagnosis not present

## 2018-06-05 DIAGNOSIS — M1611 Unilateral primary osteoarthritis, right hip: Secondary | ICD-10-CM | POA: Diagnosis not present

## 2018-06-25 DIAGNOSIS — Z6822 Body mass index (BMI) 22.0-22.9, adult: Secondary | ICD-10-CM | POA: Diagnosis not present

## 2018-06-25 DIAGNOSIS — N3090 Cystitis, unspecified without hematuria: Secondary | ICD-10-CM | POA: Diagnosis not present

## 2018-08-12 DIAGNOSIS — M25551 Pain in right hip: Secondary | ICD-10-CM | POA: Diagnosis not present

## 2018-08-12 DIAGNOSIS — M48061 Spinal stenosis, lumbar region without neurogenic claudication: Secondary | ICD-10-CM | POA: Diagnosis not present

## 2018-09-01 DIAGNOSIS — R05 Cough: Secondary | ICD-10-CM | POA: Diagnosis not present

## 2018-09-01 DIAGNOSIS — Z882 Allergy status to sulfonamides status: Secondary | ICD-10-CM | POA: Diagnosis not present

## 2018-09-01 DIAGNOSIS — J40 Bronchitis, not specified as acute or chronic: Secondary | ICD-10-CM | POA: Diagnosis not present

## 2018-09-01 DIAGNOSIS — J209 Acute bronchitis, unspecified: Secondary | ICD-10-CM | POA: Diagnosis not present

## 2018-09-07 DIAGNOSIS — J40 Bronchitis, not specified as acute or chronic: Secondary | ICD-10-CM | POA: Diagnosis not present

## 2018-09-09 DIAGNOSIS — J0101 Acute recurrent maxillary sinusitis: Secondary | ICD-10-CM | POA: Diagnosis not present

## 2018-09-09 DIAGNOSIS — Z6822 Body mass index (BMI) 22.0-22.9, adult: Secondary | ICD-10-CM | POA: Diagnosis not present

## 2018-09-09 DIAGNOSIS — N3000 Acute cystitis without hematuria: Secondary | ICD-10-CM | POA: Diagnosis not present

## 2019-01-23 DIAGNOSIS — Z96641 Presence of right artificial hip joint: Secondary | ICD-10-CM | POA: Diagnosis not present

## 2019-01-23 DIAGNOSIS — Z471 Aftercare following joint replacement surgery: Secondary | ICD-10-CM | POA: Diagnosis not present

## 2019-01-23 DIAGNOSIS — M25552 Pain in left hip: Secondary | ICD-10-CM | POA: Diagnosis not present

## 2019-02-04 DIAGNOSIS — M25551 Pain in right hip: Secondary | ICD-10-CM | POA: Diagnosis not present

## 2019-02-04 DIAGNOSIS — M5416 Radiculopathy, lumbar region: Secondary | ICD-10-CM | POA: Diagnosis not present

## 2019-02-07 DIAGNOSIS — M1611 Unilateral primary osteoarthritis, right hip: Secondary | ICD-10-CM | POA: Diagnosis not present

## 2019-02-07 DIAGNOSIS — Z01818 Encounter for other preprocedural examination: Secondary | ICD-10-CM | POA: Diagnosis not present

## 2019-02-07 DIAGNOSIS — Z1389 Encounter for screening for other disorder: Secondary | ICD-10-CM | POA: Diagnosis not present

## 2019-02-07 DIAGNOSIS — Z6822 Body mass index (BMI) 22.0-22.9, adult: Secondary | ICD-10-CM | POA: Diagnosis not present

## 2019-02-07 DIAGNOSIS — E782 Mixed hyperlipidemia: Secondary | ICD-10-CM | POA: Diagnosis not present

## 2019-02-07 DIAGNOSIS — Z0001 Encounter for general adult medical examination with abnormal findings: Secondary | ICD-10-CM | POA: Diagnosis not present

## 2019-02-07 DIAGNOSIS — M47816 Spondylosis without myelopathy or radiculopathy, lumbar region: Secondary | ICD-10-CM | POA: Diagnosis not present

## 2019-02-07 DIAGNOSIS — E559 Vitamin D deficiency, unspecified: Secondary | ICD-10-CM | POA: Diagnosis not present

## 2019-02-17 DIAGNOSIS — Z85828 Personal history of other malignant neoplasm of skin: Secondary | ICD-10-CM | POA: Diagnosis not present

## 2019-02-17 DIAGNOSIS — L821 Other seborrheic keratosis: Secondary | ICD-10-CM | POA: Diagnosis not present

## 2019-02-17 DIAGNOSIS — Z08 Encounter for follow-up examination after completed treatment for malignant neoplasm: Secondary | ICD-10-CM | POA: Diagnosis not present

## 2019-02-25 DIAGNOSIS — M5136 Other intervertebral disc degeneration, lumbar region: Secondary | ICD-10-CM | POA: Diagnosis not present

## 2019-02-26 DIAGNOSIS — H04123 Dry eye syndrome of bilateral lacrimal glands: Secondary | ICD-10-CM | POA: Diagnosis not present

## 2019-04-09 ENCOUNTER — Inpatient Hospital Stay: Admit: 2019-04-09 | Payer: Medicare Other | Admitting: Orthopedic Surgery

## 2019-04-09 DIAGNOSIS — Z6822 Body mass index (BMI) 22.0-22.9, adult: Secondary | ICD-10-CM | POA: Diagnosis not present

## 2019-04-09 DIAGNOSIS — Z96641 Presence of right artificial hip joint: Secondary | ICD-10-CM | POA: Diagnosis not present

## 2019-04-09 DIAGNOSIS — M47816 Spondylosis without myelopathy or radiculopathy, lumbar region: Secondary | ICD-10-CM | POA: Diagnosis not present

## 2019-04-09 DIAGNOSIS — M1612 Unilateral primary osteoarthritis, left hip: Secondary | ICD-10-CM | POA: Diagnosis not present

## 2019-04-09 DIAGNOSIS — M5137 Other intervertebral disc degeneration, lumbosacral region: Secondary | ICD-10-CM | POA: Diagnosis not present

## 2019-04-09 SURGERY — ARTHROPLASTY, HIP, TOTAL, ANTERIOR APPROACH
Anesthesia: Choice | Laterality: Left

## 2019-06-03 ENCOUNTER — Encounter (HOSPITAL_COMMUNITY): Payer: Self-pay

## 2019-06-03 ENCOUNTER — Encounter (HOSPITAL_COMMUNITY)
Admission: RE | Admit: 2019-06-03 | Discharge: 2019-06-03 | Disposition: A | Discharge status: Home / Self Care | Payer: Medicare Other | Source: Ambulatory Visit | Attending: Orthopedic Surgery | Admitting: Orthopedic Surgery

## 2019-06-03 ENCOUNTER — Other Ambulatory Visit: Payer: Self-pay

## 2019-06-03 DIAGNOSIS — M1612 Unilateral primary osteoarthritis, left hip: Secondary | ICD-10-CM | POA: Diagnosis not present

## 2019-06-03 DIAGNOSIS — Z01812 Encounter for preprocedural laboratory examination: Secondary | ICD-10-CM | POA: Diagnosis not present

## 2019-06-03 LAB — SURGICAL PCR SCREEN
MRSA, PCR: NEGATIVE
Staphylococcus aureus: NEGATIVE

## 2019-06-03 LAB — COMPREHENSIVE METABOLIC PANEL
ALT: 20 U/L (ref 0–44)
AST: 25 U/L (ref 15–41)
Albumin: 4.6 g/dL (ref 3.5–5.0)
Alkaline Phosphatase: 78 U/L (ref 38–126)
Anion gap: 9 (ref 5–15)
BUN: 16 mg/dL (ref 8–23)
CO2: 27 mmol/L (ref 22–32)
Calcium: 9.8 mg/dL (ref 8.9–10.3)
Chloride: 105 mmol/L (ref 98–111)
Creatinine, Ser: 0.81 mg/dL (ref 0.44–1.00)
GFR calc Af Amer: >60 mL/min (ref >60)
GFR calc non Af Amer: >60 mL/min (ref >60)
Glucose, Bld: 91 mg/dL (ref 70–99)
Potassium: 4.5 mmol/L (ref 3.5–5.1)
Sodium: 141 mmol/L (ref 135–145)
Total Bilirubin: 0.9 mg/dL (ref 0.3–1.2)
Total Protein: 7.8 g/dL (ref 6.5–8.1)

## 2019-06-03 LAB — CBC
HCT: 43.2 % (ref 36.0–46.0)
Hemoglobin: 13.8 g/dL (ref 12.0–15.0)
MCH: 30.6 pg (ref 26.0–34.0)
MCHC: 31.9 g/dL (ref 30.0–36.0)
MCV: 95.8 fL (ref 80.0–100.0)
Platelets: 190 10*3/uL (ref 150–400)
RBC: 4.51 MIL/uL (ref 3.87–5.11)
RDW: 13.3 % (ref 11.5–15.5)
WBC: 4.9 10*3/uL (ref 4.0–10.5)
nRBC: 0 % (ref 0.0–0.2)

## 2019-06-03 LAB — PROTIME-INR
INR: 0.9 (ref 0.8–1.2)
Prothrombin Time: 12 seconds (ref 11.4–15.2)

## 2019-06-03 LAB — APTT: aPTT: 31 seconds (ref 24–36)

## 2019-06-03 NOTE — Patient Instructions (Addendum)
DUE TO COVID-19 ONLY ONE VISITOR IS ALLOWED TO COME WITH YOU AND STAY IN THE WAITING ROOM ONLY DURING PRE OP AND PROCEDURE DAY OF SURGERY. THE 1 VISITOR MAY VISIT WITH YOU AFTER SURGERY IN YOUR PRIVATE ROOM DURING VISITING HOURS ONLY!  YOU NEED TO HAVE A COVID 19 TEST ON____SATURDAY 10-10___ @___11 :00am____, THIS TEST MUST BE DONE BEFORE SURGERY, COME  [address], [place] , [address].  ([place]) ONCE YOUR COVID TEST IS COMPLETED, PLEASE BEGIN THE QUARANTINE INSTRUCTIONS AS OUTLINED IN YOUR HANDOUT.                Natalie Tran   Your procedure is scheduled on: 06-11-2019   Report to [facility]  Entrance   Report to admitting at 7:55AM      Call this number if you have problems the morning of surgery [place], NO CHEWING GUM CANDY OR MINTS.    Do not eat food After Midnight. YOU MAY HAVE CLEAR LIQUIDS FROM MIDNIGHT UNTIL 7:24AM. At 7:24AM Please finish the prescribed Pre-Surgery ENSURE drink. Nothing by mouth after you finish the ENSURE drink !   CLEAR LIQUID DIET   Foods Allowed                                                                     Foods Excluded  Coffee and tea, regular and decaf                             liquids that you cannot  Plain Jell-O any favor except red or purple                                           see through such as: Fruit ices (not with fruit pulp)                                     milk, soups, orange juice  Iced Popsicles                                    All solid food Carbonated beverages, regular and diet                                    Cranberry, grape and apple juices Sports drinks like Gatorade Lightly seasoned clear broth or consume(fat free) Sugar, honey syrup  Sample Menu Breakfast                                Lunch  Supper Cranberry juice                    Beef broth                             Chicken broth Jell-O                                     Grape juice                           Apple juice Coffee or tea                        Jell-O                                      Popsicle                                                Coffee or tea                        Coffee or tea  _____________________________________________________________________     Take these medicines the morning of surgery with A SIP OF WATER:  PERCOCET IF NEEDED                                 You may not have any metal on your body including hair pins and              piercings  Do not wear jewelry, make-up, lotions, powders or perfumes, deodorant             Do not wear nail polish on your fingernails.  Do not shave  48 hours prior to surgery.     Do not bring valuables to the hospital. [place].  Contacts, dentures or bridgework may not be worn into surgery.  YOU MAY BRING A SMALL OVERNIGHT BAG              Please read over the following fact sheets you were given: _____________________________________________________________________             [facility] - Preparing for Surgery Before surgery, you can play an important role.  Because skin is not sterile, your skin needs to be as free of germs as possible.  You can reduce the number of germs on your skin by washing with CHG (chlorahexidine gluconate) soap before surgery.  CHG is an antiseptic cleaner which kills germs and bonds with the skin to continue killing germs even after washing. Please DO NOT use if you have an allergy to CHG or antibacterial soaps.  If your skin becomes reddened/irritated stop using the CHG and inform your nurse when you arrive at Short Stay. Do not shave (including legs and underarms) for at least 48 hours prior to the first CHG shower.  You may shave your  face/neck. Please follow these instructions carefully:  1.  Shower with CHG Soap the night  before surgery and the  morning of Surgery.  2.  If you choose to wash your hair, wash your hair first as usual with your  normal  shampoo.  3.  After you shampoo, rinse your hair and body thoroughly to remove the  shampoo.                           4.  Use CHG as you would any other liquid soap.  You can apply chg directly  to the skin and wash                       Gently with a scrungie or clean washcloth.  5.  Apply the CHG Soap to your body ONLY FROM THE NECK DOWN.   Do not use on face/ open                           Wound or open sores. Avoid contact with eyes, ears mouth and genitals (private parts).                       Wash face,  Genitals (private parts) with your normal soap.             6.  Wash thoroughly, paying special attention to the area where your surgery  will be performed.  7.  Thoroughly rinse your body with warm water from the neck down.  8.  DO NOT shower/wash with your normal soap after using and rinsing off  the CHG Soap.                9.  Pat yourself dry with a clean towel.            10.  Wear clean pajamas.            11.  Place clean sheets on your bed the night of your first shower and do not  sleep with pets. Day of Surgery : Do not apply any lotions/deodorants the morning of surgery.  Please wear clean clothes to the hospital/surgery center.  FAILURE TO FOLLOW THESE INSTRUCTIONS MAY RESULT IN THE CANCELLATION OF YOUR SURGERY PATIENT SIGNATURE_________________________________  NURSE SIGNATURE__________________________________  ________________________________________________________________________   Adam Phenix  An incentive spirometer is a tool that can help keep your lungs clear and active. This tool measures how well you are filling your lungs with each breath. Taking long deep breaths may help reverse or decrease the chance of developing breathing (pulmonary) problems (especially infection) following:  A long period of time when you are  unable to move or be active. BEFORE THE PROCEDURE   If the spirometer includes an indicator to show your best effort, your nurse or respiratory therapist will set it to a desired goal.  If possible, sit up straight or lean slightly forward. Try not to slouch.  Hold the incentive spirometer in an upright position. INSTRUCTIONS FOR USE  1. Sit on the edge of your bed if possible, or sit up as far as you can in bed or on a chair. 2. Hold the incentive spirometer in an upright position. 3. Breathe out normally. 4. Place the mouthpiece in your mouth and seal your lips tightly around it. 5. Breathe in slowly and as deeply as possible, raising the piston or  the ball toward the top of the column. 6. Hold your breath for 3-5 seconds or for as long as possible. Allow the piston or ball to fall to the bottom of the column. 7. Remove the mouthpiece from your mouth and breathe out normally. 8. Rest for a few seconds and repeat Steps 1 through 7 at least 10 times every 1-2 hours when you are awake. Take your time and take a few normal breaths between deep breaths. 9. The spirometer may include an indicator to show your best effort. Use the indicator as a goal to work toward during each repetition. 10. After each set of 10 deep breaths, practice coughing to be sure your lungs are clear. If you have an incision (the cut made at the time of surgery), support your incision when coughing by placing a pillow or rolled up towels firmly against it. Once you are able to get out of bed, walk around indoors and cough well. You may stop using the incentive spirometer when instructed by your caregiver.  RISKS AND COMPLICATIONS  Take your time so you do not get dizzy or light-headed.  If you are in pain, you may need to take or ask for pain medication before doing incentive spirometry. It is harder to take a deep breath if you are having pain. AFTER USE  Rest and breathe slowly and easily.  It can be helpful to  keep track of a log of your progress. Your caregiver can provide you with a simple table to help with this. If you are using the spirometer at home, follow these instructions: [place] IF:   You are having difficultly using the spirometer.  You have trouble using the spirometer as often as instructed.  Your pain medication is not giving enough relief while using the spirometer.  You develop fever of 100.5 F (38.1 C) or higher. SEEK IMMEDIATE MEDICAL CARE IF:   You cough up bloody sputum that had not been present before.  You develop fever of 102 F (38.9 C) or greater.  You develop worsening pain at or near the incision site. MAKE SURE YOU:   Understand these instructions.  Will watch your condition.  Will get help right away if you are not doing well or get worse. Document Released: 12/25/2006 Document Revised: 11/06/2011 Document Reviewed: 02/25/2007 Exit[employer] Patient Information 2014 [employer], [place].   ________________________________________________________________________  WHAT IS A BLOOD TRANSFUSION? Blood Transfusion Information  A transfusion is the replacement of blood or some of its parts. Blood is made up of multiple cells which provide different functions.  Red blood cells carry oxygen and are used for blood loss replacement.  White blood cells fight against infection.  Platelets control bleeding.  Plasma helps clot blood.  Other blood products are available for specialized needs, such as hemophilia or other clotting disorders. BEFORE THE TRANSFUSION  Who gives blood for transfusions?   Healthy volunteers who are fully evaluated to make sure their blood is safe. This is blood bank blood. Transfusion therapy is the safest it has ever been in the practice of medicine. Before blood is taken from a donor, a complete history is taken to make sure that person has no history of diseases nor engages in risky social behavior (examples are intravenous drug  use or sexual activity with multiple partners). The donor's travel history is screened to minimize risk of transmitting infections, such as malaria. The donated blood is tested for signs of infectious diseases, such as HIV and hepatitis. The  blood is then tested to be sure it is compatible with you in order to minimize the chance of a transfusion reaction. If you or a relative donates blood, this is often done in anticipation of surgery and is not appropriate for emergency situations. It takes many days to process the donated blood. RISKS AND COMPLICATIONS Although transfusion therapy is very safe and saves many lives, the main dangers of transfusion include:   Getting an infectious disease.  Developing a transfusion reaction. This is an allergic reaction to something in the blood you were given. Every precaution is taken to prevent this. The decision to have a blood transfusion has been considered carefully by your caregiver before blood is given. Blood is not given unless the benefits outweigh the risks. AFTER THE TRANSFUSION  Right after receiving a blood transfusion, you will usually feel much better and more energetic. This is especially true if your red blood cells have gotten low (anemic). The transfusion raises the level of the red blood cells which carry oxygen, and this usually causes an energy increase.  The nurse administering the transfusion will monitor you carefully for complications. HOME CARE INSTRUCTIONS  No special instructions are needed after a transfusion. You may find your energy is better. Speak with your caregiver about any limitations on activity for underlying diseases you may have. SEEK MEDICAL CARE IF:   Your condition is not improving after your transfusion.  You develop redness or irritation at the intravenous (IV) site. SEEK IMMEDIATE MEDICAL CARE IF:  Any of the following symptoms occur over the next 12 hours:  Shaking chills.  You have a temperature by mouth  above 102 F (38.9 C), not controlled by medicine.  Chest, back, or muscle pain.  People around you feel you are not acting correctly or are confused.  Shortness of breath or difficulty breathing.  Dizziness and fainting.  You get a rash or develop hives.  You have a decrease in urine output.  Your urine turns a dark color or changes to pink, red, or brown. Any of the following symptoms occur over the next 10 days:  You have a temperature by mouth above 102 F (38.9 C), not controlled by medicine.  Shortness of breath.  Weakness after normal activity.  The white part of the eye turns yellow (jaundice).  You have a decrease in the amount of urine or are urinating less often.  Your urine turns a dark color or changes to pink, red, or brown. Document Released: 08/11/2000 Document Revised: 11/06/2011 Document Reviewed: 03/30/2008 [facility] Patient Information 2014 [place], [place].  _______________________________________________________________________

## 2019-06-03 NOTE — Progress Notes (Signed)
PCP - Curlene Labrum, MD Cardiologist -   Chest x-ray -  EKG -  Stress Test -  ECHO -  Cardiac Cath -   Sleep Study -  CPAP -   Fasting Blood Sugar -  Checks Blood Sugar _____ times a day  Blood Thinner Instructions: Aspirin Instructions: Last Dose:  Anesthesia review:   Patient denies shortness of breath, fever, cough and chest pain at PAT appointment   Patient verbalized understanding of instructions that were given to them at the PAT appointment. Patient was also instructed that they will need to review over the PAT instructions again at home before surgery.

## 2019-06-07 ENCOUNTER — Other Ambulatory Visit (HOSPITAL_COMMUNITY)
Admission: RE | Admit: 2019-06-07 | Discharge: 2019-06-07 | Disposition: A | Discharge status: Home / Self Care | Payer: Medicare Other | Source: Ambulatory Visit | Attending: Orthopedic Surgery | Admitting: Orthopedic Surgery

## 2019-06-07 DIAGNOSIS — Z01812 Encounter for preprocedural laboratory examination: Secondary | ICD-10-CM | POA: Diagnosis not present

## 2019-06-07 DIAGNOSIS — Z20828 Contact with and (suspected) exposure to other viral communicable diseases: Secondary | ICD-10-CM | POA: Insufficient documentation

## 2019-06-08 LAB — NOVEL CORONAVIRUS, NAA (HOSP ORDER, SEND-OUT TO REF LAB; TAT 18-24 HRS): SARS-CoV-2, NAA: NOT DETECTED

## 2019-06-10 NOTE — H&P (Signed)
TOTAL HIP ADMISSION H&P  Patient is admitted for left total hip arthroplasty.  Subjective:  Chief Complaint: left hip pain  HPI: Natalie Tran, 78 y.o. female, has a history of pain and functional disability in the left hip(s) due to arthritis and patient has failed non-surgical conservative treatments for greater than 12 weeks to include NSAID's and/or analgesics, corticosteriod injections, flexibility and strengthening excercises, use of assistive devices and activity modification.  Onset of symptoms was gradual starting 3 years ago with gradually worsening course since that time.The patient noted no past surgery on the left hip(s).  Patient currently rates pain in the left hip at 5 out of 10 with activity. Patient has night pain, worsening of pain with activity and weight bearing, pain that interfers with activities of daily living and pain with passive range of motion. Patient has evidence of periarticular osteophytes and joint space narrowing by imaging studies. This condition presents safety issues increasing the risk of falls. There is no current active infection.  Patient Active Problem List   Diagnosis Date Noted  . OA (osteoarthritis) of hip 11/22/2016   Past Medical History:  Diagnosis Date  . 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 Arthritis   . Cancer ([place])    hx of skin cancer right cheek    Past Surgical History:  Procedure Laterality Date  . NO PAST SURGERIES    . TOTAL HIP ARTHROPLASTY Right 11/22/2016   Procedure: RIGHT TOTAL HIP ARTHROPLASTY ANTERIOR APPROACH;  Surgeon: Gaynelle Arabian, MD;  Location: [facility];  Service: Orthopedics;  Laterality: Right;       Current Outpatient Medications  Medication Sig Dispense Refill Last Dose  . naproxen sodium (ALEVE) 220 MG tablet Take 220 mg by mouth daily.     Marland Kitchen oxyCODONE-acetaminophen (PERCOCET) 7.5-325 MG tablet Take 1 tablet by mouth every 6 (six) hours as needed for severe pain.      Allergies  Allergen Reactions  .  Asa [Aspirin]     Pt states "makes stomach raw"  Pt can take motrin  . Sulfa Antibiotics Other (See Comments)    unknown    Social History   Tobacco Use  . Smoking status: Never Smoker  . Smokeless tobacco: Never Used  Substance Use Topics  . Alcohol use: Yes    Alcohol/week: 1.0 standard drinks    Types: 1 Glasses of wine per week    Comment: twice a week      Review of Systems  Constitutional: Negative.   HENT: Negative.   Eyes: Negative.   Respiratory: Negative.   Cardiovascular: Negative.   Gastrointestinal: Negative.   Genitourinary: Negative.   Musculoskeletal: Positive for joint pain and myalgias. Negative for back pain, falls and neck pain.  Skin: Negative.   Neurological: Negative.   Endo/Heme/Allergies: Negative.   Psychiatric/Behavioral: Negative for depression, hallucinations, memory loss, substance abuse and suicidal ideas. The patient is nervous/anxious. The patient does not have insomnia.     Objective:  Physical Exam  Constitutional: She is oriented to person, place, and time. She appears well-developed and well-nourished. No distress.  HENT:  Head: Normocephalic and atraumatic.  Right Ear: External ear normal.  Left Ear: External ear normal.  Nose: Nose normal.  Mouth/Throat: Oropharynx is clear and moist.  Eyes: Conjunctivae and EOM are normal.  Neck: Normal range of motion. Neck supple.  Cardiovascular: Normal rate, regular rhythm, normal heart sounds and intact distal pulses.  No murmur heard. Respiratory: Effort normal and breath sounds normal.  No respiratory distress. She has no wheezes.  GI: Soft. Bowel sounds are normal. She exhibits no distension. There is no abdominal tenderness.  Musculoskeletal:     Comments: Right Hip Exam: Well healed incision from previous total hip arthroplasty. The range of motion: Flexion to 130 degrees, Internal Rotation to 30 degrees, External Rotation to 40 degrees, and abduction to 40 degrees without  discomfort. There is no tenderness over the greater trochanter bursa. There is no pain on provocative testing of the hip.  Left Hip Exam: Left Hip Exam: ROM: Flexion to 100 degrees, Internal Rotation to 0 degrees, External Rotation to 10 degrees, and Abduction to 10 degrees with discomfort. Rotating the hip reproduces the pain radiating down her left leg. There is no tenderness over the greater trochanter bursa.  Neurological: She is alert and oriented to person, place, and time. She has normal strength. No sensory deficit.  Skin: No rash noted. She is not diaphoretic. No erythema.  Psychiatric: She has a normal mood and affect. Her behavior is normal.    Ht: 5 ft 4 in Wt: 131 lbs  BMI: 22.5  BP: 142/84 sitting L arm Pulse: 84 bpm   Imaging Review Plain radiographs demonstrate severe degenerative joint disease of the left hip(s). The bone quality appears to be good for age and reported activity level.   Assessment/Plan:  End stage primary osteoarthritis, left hip(s)  The patient history, physical examination, clinical judgement of the provider and imaging studies are consistent with end stage degenerative joint disease of the left hip(s) and total hip arthroplasty is deemed medically necessary. The treatment options including medical management, injection therapy, arthroscopy and arthroplasty were discussed at length. The risks and benefits of total hip arthroplasty were presented and reviewed. The risks due to aseptic loosening, infection, stiffness, dislocation/subluxation,  thromboembolic complications and other imponderables were discussed.  The patient acknowledged the explanation, agreed to proceed with the plan and consent was signed. Patient is being admitted for inpatient treatment for surgery, pain control, PT, OT, prophylactic antibiotics, VTE prophylaxis, progressive ambulation and ADL's and discharge planning.The patient is planning to be discharged home.    Risks and  benefits of the surgery were discussed with the patient and Dr.Aluisio at their previous office visit, and the patient has elected to move forward with the aforementioned surgery. Post-operative care plans were discussed with the patient today.  Therapy Plans: HEP vs HHPT Disposition: Home with daughters Planned DVT prophylaxis: aspirin 325mg  BID DME needed: rolling walker PCP: Dr. Pleas Koch Other: Has concerns because she reports that she "woke up" during her right THA  Instructed patient on meds to stop prior to surgery  Ardeen Jourdain, PA-C

## 2019-06-11 ENCOUNTER — Inpatient Hospital Stay (HOSPITAL_COMMUNITY): Payer: Medicare Other | Admitting: Certified Registered"

## 2019-06-11 ENCOUNTER — Inpatient Hospital Stay (HOSPITAL_COMMUNITY)
Admission: RE | Admit: 2019-06-11 | Discharge: 2019-06-12 | DRG: 470 | Disposition: A | Discharge status: Home / Self Care | Payer: Medicare Other | Attending: Orthopedic Surgery | Admitting: Orthopedic Surgery

## 2019-06-11 ENCOUNTER — Inpatient Hospital Stay (HOSPITAL_COMMUNITY): Payer: Medicare Other

## 2019-06-11 ENCOUNTER — Other Ambulatory Visit: Payer: Self-pay

## 2019-06-11 ENCOUNTER — Inpatient Hospital Stay (HOSPITAL_COMMUNITY): Payer: Medicare Other | Admitting: Physician Assistant

## 2019-06-11 ENCOUNTER — Encounter (HOSPITAL_COMMUNITY)
Admission: RE | Disposition: A | Discharge status: Home / Self Care | Payer: Self-pay | Source: Home / Self Care | Attending: Orthopedic Surgery

## 2019-06-11 ENCOUNTER — Encounter (HOSPITAL_COMMUNITY): Payer: Self-pay | Admitting: *Deleted

## 2019-06-11 DIAGNOSIS — M169 Osteoarthritis of hip, unspecified: Secondary | ICD-10-CM | POA: Diagnosis present

## 2019-06-11 DIAGNOSIS — M1612 Unilateral primary osteoarthritis, left hip: Principal | ICD-10-CM | POA: Diagnosis present

## 2019-06-11 DIAGNOSIS — Z96649 Presence of unspecified artificial hip joint: Secondary | ICD-10-CM

## 2019-06-11 DIAGNOSIS — Z96641 Presence of right artificial hip joint: Secondary | ICD-10-CM | POA: Diagnosis present

## 2019-06-11 DIAGNOSIS — Z471 Aftercare following joint replacement surgery: Secondary | ICD-10-CM | POA: Diagnosis not present

## 2019-06-11 DIAGNOSIS — Z85828 Personal history of other malignant neoplasm of skin: Secondary | ICD-10-CM | POA: Diagnosis not present

## 2019-06-11 DIAGNOSIS — Z886 Allergy status to analgesic agent status: Secondary | ICD-10-CM | POA: Diagnosis not present

## 2019-06-11 DIAGNOSIS — Z882 Allergy status to sulfonamides status: Secondary | ICD-10-CM | POA: Diagnosis not present

## 2019-06-11 DIAGNOSIS — Z791 Long term (current) use of non-steroidal anti-inflammatories (NSAID): Secondary | ICD-10-CM | POA: Diagnosis not present

## 2019-06-11 DIAGNOSIS — Z96642 Presence of left artificial hip joint: Secondary | ICD-10-CM | POA: Diagnosis not present

## 2019-06-11 DIAGNOSIS — Z419 Encounter for procedure for purposes other than remedying health state, unspecified: Secondary | ICD-10-CM

## 2019-06-11 HISTORY — PX: TOTAL HIP ARTHROPLASTY: SHX124

## 2019-06-11 LAB — TYPE AND SCREEN
ABO/RH(D): O POS
Antibody Screen: NEGATIVE

## 2019-06-11 SURGERY — ARTHROPLASTY, HIP, TOTAL, ANTERIOR APPROACH
Anesthesia: Spinal | Laterality: Left

## 2019-06-11 MED ORDER — PROPOFOL 10 MG/ML IV BOLUS
INTRAVENOUS | Status: AC
  Filled 2019-06-11: qty 40

## 2019-06-11 MED ORDER — CEFAZOLIN SODIUM-DEXTROSE 2-4 GM/100ML-% IV SOLN
2.0000 g | INTRAVENOUS | Status: AC
  Administered 2019-06-11: 2 g via INTRAVENOUS

## 2019-06-11 MED ORDER — OXYCODONE HCL 5 MG/5ML PO SOLN: 5.0000 mg | Freq: Once | ORAL | Status: DC | PRN

## 2019-06-11 MED ORDER — RIVAROXABAN 10 MG PO TABS
10.0000 mg | ORAL_TABLET | Freq: Every day | ORAL | Status: DC
  Administered 2019-06-12: 10 mg via ORAL
  Filled 2019-06-11: qty 1

## 2019-06-11 MED ORDER — ONDANSETRON HCL 4 MG/2ML IJ SOLN: 4.0000 mg | Freq: Four times a day (QID) | INTRAMUSCULAR | Status: DC | PRN

## 2019-06-11 MED ORDER — PROPOFOL 500 MG/50ML IV EMUL
INTRAVENOUS | Status: DC | PRN
  Administered 2019-06-11: 50 ug/kg/min via INTRAVENOUS

## 2019-06-11 MED ORDER — DIPHENHYDRAMINE HCL 12.5 MG/5ML PO ELIX: 12.5000 mg | ORAL_SOLUTION | ORAL | Status: DC | PRN

## 2019-06-11 MED ORDER — FENTANYL CITRATE (PF) 100 MCG/2ML IJ SOLN
25.0000 ug | INTRAMUSCULAR | Status: DC | PRN
  Administered 2019-06-11 (×2): 25 ug via INTRAVENOUS

## 2019-06-11 MED ORDER — DEXAMETHASONE SODIUM PHOSPHATE 10 MG/ML IJ SOLN
8.0000 mg | Freq: Once | INTRAMUSCULAR | Status: AC
  Administered 2019-06-11: 10:00:00 8 mg via INTRAVENOUS

## 2019-06-11 MED ORDER — PHENYLEPHRINE 40 MCG/ML (10ML) SYRINGE FOR IV PUSH (FOR BLOOD PRESSURE SUPPORT)
PREFILLED_SYRINGE | INTRAVENOUS | Status: DC | PRN
  Administered 2019-06-11: 80 ug via INTRAVENOUS
  Administered 2019-06-11 (×2): 40 ug via INTRAVENOUS

## 2019-06-11 MED ORDER — EPHEDRINE 5 MG/ML INJ
INTRAVENOUS | Status: AC
  Filled 2019-06-11: qty 10

## 2019-06-11 MED ORDER — PHENYLEPHRINE 40 MCG/ML (10ML) SYRINGE FOR IV PUSH (FOR BLOOD PRESSURE SUPPORT)
PREFILLED_SYRINGE | INTRAVENOUS | Status: AC
  Filled 2019-06-11: qty 10

## 2019-06-11 MED ORDER — PHENOL 1.4 % MT LIQD: 1.0000 | OROMUCOSAL | Status: DC | PRN

## 2019-06-11 MED ORDER — OXYCODONE HCL 5 MG PO TABS
5.0000 mg | ORAL_TABLET | ORAL | Status: DC | PRN
  Administered 2019-06-11: 14:00:00 5 mg via ORAL
  Administered 2019-06-11: 10 mg via ORAL
  Administered 2019-06-11 – 2019-06-12 (×4): 5 mg via ORAL
  Filled 2019-06-11 (×3): qty 1
  Filled 2019-06-11 (×2): qty 2
  Filled 2019-06-11: qty 1

## 2019-06-11 MED ORDER — ACETAMINOPHEN 500 MG PO TABS
1000.0000 mg | ORAL_TABLET | Freq: Four times a day (QID) | ORAL | Status: AC
  Administered 2019-06-11 – 2019-06-12 (×4): 1000 mg via ORAL
  Filled 2019-06-11 (×4): qty 2

## 2019-06-11 MED ORDER — MENTHOL 3 MG MT LOZG: 1.0000 | LOZENGE | OROMUCOSAL | Status: DC | PRN

## 2019-06-11 MED ORDER — TRANEXAMIC ACID-NACL 1000-0.7 MG/100ML-% IV SOLN
1000.0000 mg | INTRAVENOUS | Status: AC
  Administered 2019-06-11: 10:00:00 1000 mg via INTRAVENOUS

## 2019-06-11 MED ORDER — DEXAMETHASONE SODIUM PHOSPHATE 10 MG/ML IJ SOLN
10.0000 mg | Freq: Once | INTRAMUSCULAR | Status: DC
  Filled 2019-06-11: qty 1

## 2019-06-11 MED ORDER — DEXAMETHASONE SODIUM PHOSPHATE 10 MG/ML IJ SOLN
INTRAMUSCULAR | Status: AC
  Filled 2019-06-11: qty 1

## 2019-06-11 MED ORDER — SODIUM CHLORIDE 0.9 % IV SOLN
INTRAVENOUS | Status: DC | PRN
  Administered 2019-06-11: 40 ug/min via INTRAVENOUS

## 2019-06-11 MED ORDER — ONDANSETRON HCL 4 MG PO TABS: 4.0000 mg | ORAL_TABLET | Freq: Four times a day (QID) | ORAL | Status: DC | PRN

## 2019-06-11 MED ORDER — ONDANSETRON HCL 4 MG/2ML IJ SOLN
INTRAMUSCULAR | Status: DC | PRN
  Administered 2019-06-11: 4 mg via INTRAVENOUS

## 2019-06-11 MED ORDER — FLEET ENEMA 7-19 GM/118ML RE ENEM: 1.0000 | ENEMA | Freq: Once | RECTAL | Status: DC | PRN

## 2019-06-11 MED ORDER — BUPIVACAINE HCL (PF) 0.25 % IJ SOLN
INTRAMUSCULAR | Status: AC
  Filled 2019-06-11: qty 30

## 2019-06-11 MED ORDER — BUPIVACAINE IN DEXTROSE 0.75-8.25 % IT SOLN
INTRATHECAL | Status: DC | PRN
  Administered 2019-06-11: 1.6 mL via INTRATHECAL

## 2019-06-11 MED ORDER — PROPOFOL 500 MG/50ML IV EMUL
INTRAVENOUS | Status: AC
  Filled 2019-06-11: qty 50

## 2019-06-11 MED ORDER — POVIDONE-IODINE 10 % EX SWAB
2.0000 "application " | Freq: Once | CUTANEOUS | Status: AC
  Administered 2019-06-11: 2 via TOPICAL

## 2019-06-11 MED ORDER — PHENYLEPHRINE HCL (PRESSORS) 10 MG/ML IV SOLN
INTRAVENOUS | Status: AC
  Filled 2019-06-11: qty 1

## 2019-06-11 MED ORDER — ONDANSETRON HCL 4 MG/2ML IJ SOLN
INTRAMUSCULAR | Status: AC
  Filled 2019-06-11: qty 2

## 2019-06-11 MED ORDER — ACETAMINOPHEN 10 MG/ML IV SOLN
1000.0000 mg | Freq: Four times a day (QID) | INTRAVENOUS | Status: DC
  Administered 2019-06-11: 1000 mg via INTRAVENOUS

## 2019-06-11 MED ORDER — 0.9 % SODIUM CHLORIDE (POUR BTL) OPTIME
TOPICAL | Status: DC | PRN
  Administered 2019-06-11: 1000 mL

## 2019-06-11 MED ORDER — SODIUM CHLORIDE 0.9 % IV SOLN
INTRAVENOUS | Status: DC
  Administered 2019-06-11: 15:00:00 via INTRAVENOUS

## 2019-06-11 MED ORDER — FENTANYL CITRATE (PF) 100 MCG/2ML IJ SOLN
INTRAMUSCULAR | Status: DC | PRN
  Administered 2019-06-11: 25 ug via INTRAVENOUS
  Administered 2019-06-11: 50 ug via INTRAVENOUS

## 2019-06-11 MED ORDER — TRANEXAMIC ACID-NACL 1000-0.7 MG/100ML-% IV SOLN
INTRAVENOUS | Status: AC
  Filled 2019-06-11: qty 100

## 2019-06-11 MED ORDER — ACETAMINOPHEN 10 MG/ML IV SOLN
INTRAVENOUS | Status: AC
  Filled 2019-06-11: qty 100

## 2019-06-11 MED ORDER — BISACODYL 10 MG RE SUPP: 10.0000 mg | Freq: Every day | RECTAL | Status: DC | PRN

## 2019-06-11 MED ORDER — CEFAZOLIN SODIUM-DEXTROSE 1-4 GM/50ML-% IV SOLN
1.0000 g | Freq: Four times a day (QID) | INTRAVENOUS | Status: AC
  Administered 2019-06-11: 1 g via INTRAVENOUS
  Filled 2019-06-11 (×2): qty 50

## 2019-06-11 MED ORDER — LIDOCAINE 2% (20 MG/ML) 5 ML SYRINGE
INTRAMUSCULAR | Status: AC
  Filled 2019-06-11: qty 5

## 2019-06-11 MED ORDER — POLYETHYLENE GLYCOL 3350 17 G PO PACK: 17.0000 g | PACK | Freq: Every day | ORAL | Status: DC | PRN

## 2019-06-11 MED ORDER — CHLORHEXIDINE GLUCONATE 4 % EX LIQD: 60.0000 mL | Freq: Once | CUTANEOUS | Status: DC

## 2019-06-11 MED ORDER — BUPIVACAINE HCL 0.25 % IJ SOLN
INTRAMUSCULAR | Status: DC | PRN
  Administered 2019-06-11: 30 mL

## 2019-06-11 MED ORDER — METHOCARBAMOL 500 MG PO TABS
500.0000 mg | ORAL_TABLET | Freq: Four times a day (QID) | ORAL | Status: DC | PRN
  Administered 2019-06-11 – 2019-06-12 (×3): 500 mg via ORAL
  Filled 2019-06-11 (×3): qty 1

## 2019-06-11 MED ORDER — OXYCODONE HCL 5 MG PO TABS: 5.0000 mg | ORAL_TABLET | Freq: Once | ORAL | Status: DC | PRN

## 2019-06-11 MED ORDER — METOCLOPRAMIDE HCL 5 MG PO TABS: 5.0000 mg | ORAL_TABLET | Freq: Three times a day (TID) | ORAL | Status: DC | PRN

## 2019-06-11 MED ORDER — FENTANYL CITRATE (PF) 100 MCG/2ML IJ SOLN
INTRAMUSCULAR | Status: AC
  Filled 2019-06-11: qty 2

## 2019-06-11 MED ORDER — PROPOFOL 10 MG/ML IV BOLUS
INTRAVENOUS | Status: DC | PRN
  Administered 2019-06-11: 20 mg via INTRAVENOUS
  Administered 2019-06-11: 10 mg via INTRAVENOUS

## 2019-06-11 MED ORDER — LACTATED RINGERS IV SOLN
INTRAVENOUS | Status: DC
  Administered 2019-06-11: 09:00:00 via INTRAVENOUS

## 2019-06-11 MED ORDER — TRAMADOL HCL 50 MG PO TABS: 50.0000 mg | ORAL_TABLET | Freq: Four times a day (QID) | ORAL | Status: DC | PRN

## 2019-06-11 MED ORDER — DOCUSATE SODIUM 100 MG PO CAPS
100.0000 mg | ORAL_CAPSULE | Freq: Two times a day (BID) | ORAL | Status: DC
  Administered 2019-06-11 – 2019-06-12 (×2): 100 mg via ORAL
  Filled 2019-06-11 (×2): qty 1

## 2019-06-11 MED ORDER — CEFAZOLIN SODIUM-DEXTROSE 2-4 GM/100ML-% IV SOLN
INTRAVENOUS | Status: AC
  Filled 2019-06-11: qty 100

## 2019-06-11 MED ORDER — METHOCARBAMOL 500 MG IVPB - SIMPLE MED
INTRAVENOUS | Status: AC
  Filled 2019-06-11: qty 50

## 2019-06-11 MED ORDER — METHOCARBAMOL 500 MG IVPB - SIMPLE MED
500.0000 mg | Freq: Four times a day (QID) | INTRAVENOUS | Status: DC | PRN
  Administered 2019-06-11: 500 mg via INTRAVENOUS
  Filled 2019-06-11: qty 50

## 2019-06-11 MED ORDER — METOCLOPRAMIDE HCL 5 MG/ML IJ SOLN: 5.0000 mg | Freq: Three times a day (TID) | INTRAMUSCULAR | Status: DC | PRN

## 2019-06-11 MED ORDER — MORPHINE SULFATE (PF) 2 MG/ML IV SOLN: 1.0000 mg | INTRAVENOUS | Status: DC | PRN

## 2019-06-11 SURGICAL SUPPLY — 45 items
BAG DECANTER FOR FLEXI CONT (MISCELLANEOUS) IMPLANT
BAG ZIPLOCK 12X15 (MISCELLANEOUS) IMPLANT
BLADE SAG 18X100X1.27 (BLADE) ×3 IMPLANT
CLOSURE WOUND 1/2 X4 (GAUZE/BANDAGES/DRESSINGS) ×1
COVER PERINEAL POST (MISCELLANEOUS) ×3 IMPLANT
COVER SURGICAL LIGHT HANDLE (MISCELLANEOUS) ×3 IMPLANT
COVER WAND RF STERILE (DRAPES) IMPLANT
CUP ACETBLR 48 OD SECTOR II (Hips) ×3 IMPLANT
DECANTER SPIKE VIAL GLASS SM (MISCELLANEOUS) ×3 IMPLANT
DRAPE STERI IOBAN 125X83 (DRAPES) ×3 IMPLANT
DRAPE U-SHAPE 47X51 STRL (DRAPES) ×6 IMPLANT
DRSG ADAPTIC 3X8 NADH LF (GAUZE/BANDAGES/DRESSINGS) ×3 IMPLANT
DRSG MEPILEX BORDER 4X4 (GAUZE/BANDAGES/DRESSINGS) ×3 IMPLANT
DRSG MEPILEX BORDER 4X8 (GAUZE/BANDAGES/DRESSINGS) ×3 IMPLANT
DURAPREP 26ML APPLICATOR (WOUND CARE) ×3 IMPLANT
ELECT REM PT RETURN 15FT ADLT (MISCELLANEOUS) ×3 IMPLANT
EVACUATOR 1/8 PVC DRAIN (DRAIN) ×3 IMPLANT
GLOVE BIO SURGEON STRL SZ 6 (GLOVE) IMPLANT
GLOVE BIO SURGEON STRL SZ7 (GLOVE) IMPLANT
GLOVE BIO SURGEON STRL SZ8 (GLOVE) ×18 IMPLANT
GLOVE BIOGEL PI IND STRL 6.5 (GLOVE) IMPLANT
GLOVE BIOGEL PI IND STRL 7.0 (GLOVE) IMPLANT
GLOVE BIOGEL PI IND STRL 8 (GLOVE) ×1 IMPLANT
GLOVE BIOGEL PI INDICATOR 6.5 (GLOVE)
GLOVE BIOGEL PI INDICATOR 7.0 (GLOVE)
GLOVE BIOGEL PI INDICATOR 8 (GLOVE) ×2
GOWN STRL REUS W/TWL LRG LVL3 (GOWN DISPOSABLE) ×12 IMPLANT
GOWN STRL REUS W/TWL XL LVL3 (GOWN DISPOSABLE) IMPLANT
HEAD FEM STD 28X+1.5 STRL (Hips) ×3 IMPLANT
HOLDER FOLEY CATH W/STRAP (MISCELLANEOUS) ×3 IMPLANT
KIT TURNOVER KIT A (KITS) ×3 IMPLANT
LINER MARATHON 28 48 (Hips) ×3 IMPLANT
MANIFOLD NEPTUNE II (INSTRUMENTS) ×3 IMPLANT
PACK ANTERIOR HIP CUSTOM (KITS) ×3 IMPLANT
STEM FEMORAL SZ 6MM STD ACTIS (Stem) ×3 IMPLANT
STRIP CLOSURE SKIN 1/2X4 (GAUZE/BANDAGES/DRESSINGS) ×2 IMPLANT
SUT ETHIBOND NAB CT1 #1 30IN (SUTURE) ×3 IMPLANT
SUT MNCRL AB 4-0 PS2 18 (SUTURE) ×3 IMPLANT
SUT STRATAFIX 0 PDS 27 VIOLET (SUTURE) ×3
SUT VIC AB 2-0 CT1 27 (SUTURE) ×4
SUT VIC AB 2-0 CT1 TAPERPNT 27 (SUTURE) ×2 IMPLANT
SUTURE STRATFX 0 PDS 27 VIOLET (SUTURE) ×1 IMPLANT
SYR 50ML LL SCALE MARK (SYRINGE) IMPLANT
TRAY FOLEY MTR SLVR 16FR STAT (SET/KITS/TRAYS/PACK) ×3 IMPLANT
YANKAUER SUCT BULB TIP 10FT TU (MISCELLANEOUS) ×3 IMPLANT

## 2019-06-11 NOTE — Evaluation (Signed)
Physical Therapy Evaluation Patient Details Name: Natalie Tran MRN: [id number] DOB: 06-21-1941 Today's Date: 06/11/2019   History of Present Illness  Patient is 78 y.o. female s/p Lt THA with PMH significant for arthritis and past Rt THA.  Clinical Impression  Natalie Tran is a 78 y.o. female POD 0 s/p Lt THA anterior approach. Patient reports independence with mobility at baseline with occasional use of SPC due to hip pain. Patient is now limited by functional impairments (see PT problem list below) and requires min assist for transfers and gait with RW. Patient was able to ambulate ~100 feet with RW and cues for safe use at start. Patient instructed in exercise to facilitate ROM and circulation to manage edema. Patient will benefit from continued skilled PT interventions to address impairments and progress towards PLOF. Acute PT will follow to progress mobility and stair training in preparation for safe discharge home.     Follow Up Recommendations Follow surgeon's recommendation for DC plan and follow-up therapies    Equipment Recommendations  Rolling walker with 5" wheels    Recommendations for Other Services       Precautions / Restrictions Precautions Precautions: Fall Restrictions Weight Bearing Restrictions: No      Mobility  Bed Mobility Overal bed mobility: Needs Assistance Bed Mobility: Supine to Sit     Supine to sit: HOB elevated;Supervision     General bed mobility comments: cues for sequencing and use of bed rails, no physical assist required, pt slow to move  Transfers Overall transfer level: Needs assistance Equipment used: Rolling walker (2 wheeled) Transfers: Sit to/from Stand Sit to Stand: Min assist         General transfer comment: cues for safe hand placement and technique, assist to initiate power up and steady with rising  Ambulation/Gait Ambulation/Gait assistance: Min guard Gait Distance (Feet): 100 Feet Assistive device:  Rolling walker (2 wheeled) Gait Pattern/deviations: Step-through pattern;Decreased step length - right;Decreased step length - left;Decreased stride length Gait velocity: slow   General Gait Details: cues for safe hand placement at start and pt maintained throughout, no overt LOB noted, pt with small steps  Stairs            Wheelchair Mobility    Modified Rankin (Stroke Patients Only)       Balance Overall balance assessment: Needs assistance Sitting-balance support: No upper extremity supported;Feet supported Sitting balance-Leahy Scale: Good     Standing balance support: During functional activity;Bilateral upper extremity supported Standing balance-Leahy Scale: Fair Standing balance comment: pt able to stand without support statically however support required for gait            Pertinent Vitals/Pain Pain Assessment: 0-10 Pain Score: 3  Pain Location: Lt hip Pain Descriptors / Indicators: Tightness Pain Intervention(s): Limited activity within patient's tolerance;Monitored during session;Ice applied;Repositioned    Home Living Family/patient expects to be discharged to:: Private residence Living Arrangements: Children(son, he is disbaled) Available Help at Discharge: Family(granddaughter, daugther, and sister will help for 3 weeks) Type of Home: House Home Access: Stairs to enter;Ramped entrance Entrance Stairs-Rails: None Entrance Stairs-Number of Steps: 2 steps Home Layout: One level Home Equipment: [place] - 4 wheels;Shower seat;Bedside commode;Wheelchair - manual Additional Comments: 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, she has hired someone to come in and assist him while she recovers from her surgery    Prior Function Level of Independence: Independent with assistive device(s)         Comments: pt using cane  sometimes due to pain     Hand Dominance        Extremity/Trunk Assessment   Upper Extremity Assessment Upper Extremity  Assessment: Overall WFL for tasks assessed    Lower Extremity Assessment Lower Extremity Assessment: LLE deficits/detail LLE Deficits / Details: pt able to perform heel slide with no increase in pain, 3/5 or better LLE Sensation: WNL LLE Coordination: WNL    Cervical / Trunk Assessment Cervical / Trunk Assessment: Normal  Communication   Communication: No difficulties  Cognition Arousal/Alertness: Awake/alert Behavior During Therapy: WFL for tasks assessed/performed Overall Cognitive Status: Within Functional Limits for tasks assessed                 General Comments      Exercises Total Joint Exercises Ankle Circles/Pumps: AROM;Both;Seated;10 reps Heel Slides: AROM;10 reps;Left;Seated   Assessment/Plan    PT Assessment Patient needs continued PT services  PT Problem List Decreased strength;Decreased balance;Decreased range of motion;Decreased mobility;Decreased knowledge of use of DME;Decreased activity tolerance       PT Treatment Interventions Gait training;DME instruction;Functional mobility training;Patient/family education;Balance training;Therapeutic activities;Therapeutic exercise;Stair training    PT Goals (Current goals can be found in the Care Plan section)  Acute Rehab PT Goals Patient Stated Goal: get back to walking indepenently PT Goal Formulation: With patient Time For Goal Achievement: 06/18/19 Potential to Achieve Goals: Good    Frequency 7X/week    AM-PAC PT "6 Clicks" Mobility  Outcome Measure Help needed turning from your back to your side while in a flat bed without using bedrails?: A Little Help needed moving from lying on your back to sitting on the side of a flat bed without using bedrails?: A Little Help needed moving to and from a bed to a chair (including a wheelchair)?: A Little Help needed standing up from a chair using your arms (e.g., wheelchair or bedside chair)?: A Little Help needed to walk in hospital room?: A Little Help  needed climbing 3-5 steps with a railing? : A Little 6 Click Score: 18    End of Session Equipment Utilized During Treatment: Gait belt Activity Tolerance: Patient tolerated treatment well Patient left: in chair;with call bell/phone within reach;with family/visitor present;with chair alarm set Nurse Communication: Mobility status PT Visit Diagnosis: Muscle weakness (generalized) (M62.81);Difficulty in walking, not elsewhere classified (R26.2)    Time: [id number] PT Time Calculation (min) (ACUTE ONLY): 22 min   Charges:   PT Evaluation $PT Eval Low Complexity: 1 Low         Kipp Brood, PT, DPT Physical Therapist with [facility]  06/11/2019 4:57 PM

## 2019-06-11 NOTE — Interval H&P Note (Signed)
History and Physical Interval Note:  06/11/2019 8:16 AM  Natalie Tran  has presented today for surgery, with the diagnosis of left hip osteoarthritis.  The various methods of treatment have been discussed with the patient and family. After consideration of risks, benefits and other options for treatment, the patient has consented to  Procedure(s) with comments: [place] (Left) - 115min as a surgical intervention.  The patient's history has been reviewed, patient examined, no change in status, stable for surgery.  I have reviewed the patient's chart and labs.  Questions were answered to the patient's satisfaction.     Pilar Plate Pharrah Rottman

## 2019-06-11 NOTE — Care Plan (Signed)
Ortho Bundle Case Management Note  Patient Details  Name: Natalie Tran MRN: [id number] Date of Birth: 05-01-1941  L THA on 06-11-19 DCP:  Home with granddtr. 1 story home with 2 ste. DME:  No needs. Has a rollator and handicap accessible toilets.   Patient had difficulty using the RW with her previous R THA and manages well with the rollator.  MD is aware and approves.    PT:  HEP                 DME Arranged:  N/A DME Agency:  NA  HH Arranged:  NA HH Agency:  NA  Additional Comments: Please contact me with any questions of if this plan should need to change.  Marianne Sofia, RN,CCM [facility]  [phone] 06/11/2019, 1:18 PM

## 2019-06-11 NOTE — Discharge Instructions (Addendum)
°Dr. Frank Aluisio °Total Joint Specialist °[facility] °[address] °, [place] [id number] °[phone] ° °ANTERIOR APPROACH TOTAL HIP REPLACEMENT POSTOPERATIVE DIRECTIONS ° ° °Hip Rehabilitation, Guidelines Following Surgery  °The results of a hip operation are greatly improved after range of motion and muscle strengthening exercises. Follow all safety measures which are given to protect your hip. If any of these exercises cause increased pain or swelling in your joint, decrease the amount until you are comfortable again. Then slowly increase the exercises. Call your caregiver if you have problems or questions.  ° °HOME CARE INSTRUCTIONS  °• Remove items at home which could result in a fall. This includes throw rugs or furniture in walking pathways.  °· ICE to the affected hip every three hours for 30 minutes at a time and then as needed for pain and swelling.  Continue to use ice on the hip for pain and swelling from surgery. You may notice swelling that will progress down to the foot and ankle.  This is normal after surgery.  Elevate the leg when you are not up walking on it.   °· Continue to use the breathing machine which will help keep your temperature down.  It is common for your temperature to cycle up and down following surgery, especially at night when you are not up moving around and exerting yourself.  The breathing machine keeps your lungs expanded and your temperature down. ° °DIET °You may resume your previous home diet once your are discharged from the hospital. ° °DRESSING / WOUND CARE / SHOWERING °You may shower 3 days after surgery, but keep the wounds dry during showering.  You may use an occlusive plastic wrap (Press'n Seal for example), NO SOAKING/SUBMERGING IN THE BATHTUB.  If the bandage gets wet, change with a clean dry gauze.  If the incision gets wet, pat the wound dry with a clean towel. °You may start showering once you are discharged home but do not submerge the  incision under water. Just pat the incision dry and apply a dry gauze dressing on daily. °Change the surgical dressing daily and reapply a dry dressing each time. ° °ACTIVITY °Walk with your walker as instructed. °Use walker as long as suggested by your caregivers. °Avoid periods of inactivity such as sitting longer than an hour when not asleep. This helps prevent blood clots.  °You may resume a sexual relationship in one month or when given the OK by your doctor.  °You may return to work once you are cleared by your doctor.  °Do not drive a car for 6 weeks or until released by you surgeon.  °Do not drive while taking narcotics. ° °WEIGHT BEARING °Weight bearing as tolerated with assist device (walker, cane, etc) as directed, use it as long as suggested by your surgeon or therapist, typically at least 4-6 weeks. ° °POSTOPERATIVE CONSTIPATION PROTOCOL °Constipation - defined medically as fewer than three stools per week and severe constipation as less than one stool per week. ° °One of the most common issues patients have following surgery is constipation.  Even if you have a regular bowel pattern at home, your normal regimen is likely to be disrupted due to multiple reasons following surgery.  Combination of anesthesia, postoperative narcotics, change in appetite and fluid intake all can affect your bowels.  In order to avoid complications following surgery, here are some recommendations in order to help you during your recovery period. ° °Colace (docusate) - Pick up an over-the-counter form   of Colace or another stool softener and take twice a day as long as you are requiring postoperative pain medications.  Take with a full glass of water daily.  If you experience loose stools or diarrhea, hold the colace until you stool forms back up.  If your symptoms do not get better within 1 week or if they get worse, check with your doctor. ° °Dulcolax (bisacodyl) - Pick up over-the-counter and take as directed by the product  packaging as needed to assist with the movement of your bowels.  Take with a full glass of water.  Use this product as needed if not relieved by Colace only.  ° °MiraLax (polyethylene glycol) - Pick up over-the-counter to have on hand.  MiraLax is a solution that will increase the amount of water in your bowels to assist with bowel movements.  Take as directed and can mix with a glass of water, juice, soda, coffee, or tea.  Take if you go more than two days without a movement. °Do not use MiraLax more than once per day. Call your doctor if you are still constipated or irregular after using this medication for 7 days in a row. ° °If you continue to have problems with postoperative constipation, please contact the office for further assistance and recommendations.  If you experience "the worst abdominal pain ever" or develop nausea or vomiting, please contact the office immediatly for further recommendations for treatment. ° °ITCHING ° If you experience itching with your medications, try taking only a single pain pill, or even half a pain pill at a time.  You can also use Benadryl over the counter for itching or also to help with sleep.  ° °TED HOSE STOCKINGS °Wear the elastic stockings on both legs for three weeks following surgery during the day but you may remove then at night for sleeping. ° °MEDICATIONS °See your medication summary on the “After Visit Summary” that the nursing staff will review with you prior to discharge.  You may have some home medications which will be placed on hold until you complete the course of blood thinner medication.  It is important for you to complete the blood thinner medication as prescribed by your surgeon.  Continue your approved medications as instructed at time of discharge. ° °PRECAUTIONS °If you experience chest pain or shortness of breath - call 911 immediately for transfer to the hospital emergency department.  °If you develop a fever greater that 101 F, purulent drainage  from wound, increased redness or drainage from wound, foul odor from the wound/dressing, or calf pain - CONTACT YOUR SURGEON.   °                                                °FOLLOW-UP APPOINTMENTS °Make sure you keep all of your appointments after your operation with your surgeon and caregivers. You should call the office at the above phone number and make an appointment for approximately two weeks after the date of your surgery or on the date instructed by your surgeon outlined in the "After Visit Summary". ° °RANGE OF MOTION AND STRENGTHENING EXERCISES  °These exercises are designed to help you keep full movement of your hip joint. Follow your caregiver's or physical therapist's instructions. Perform all exercises about fifteen times, three times per day or as directed. Exercise both hips, even if you have   had only one joint replacement. These exercises can be done on a training (exercise) mat, on the floor, on a table or on a bed. Use whatever works the best and is most comfortable for you. Use music or television while you are exercising so that the exercises are a pleasant break in your day. This will make your life better with the exercises acting as a break in routine you can look forward to.  °• Lying on your back, slowly slide your foot toward your buttocks, raising your knee up off the floor. Then slowly slide your foot back down until your leg is straight again.  °• Lying on your back spread your legs as far apart as you can without causing discomfort.  °• Lying on your side, raise your upper leg and foot straight up from the floor as far as is comfortable. Slowly lower the leg and repeat.  °• Lying on your back, tighten up the muscle in the front of your thigh (quadriceps muscles). You can do this by keeping your leg straight and trying to raise your heel off the floor. This helps strengthen the largest muscle supporting your knee.  °• Lying on your back, tighten up the muscles of your buttocks both  with the legs straight and with the knee bent at a comfortable angle while keeping your heel on the floor.  ° °IF YOU ARE TRANSFERRED TO A SKILLED REHAB FACILITY °If the patient is transferred to a skilled rehab facility following release from the hospital, a list of the current medications will be sent to the facility for the patient to continue.  When discharged from the skilled rehab facility, please have the facility set up the patient's Home Health Physical Therapy prior to being released. Also, the skilled facility will be responsible for providing the patient with their medications at time of release from the facility to include their pain medication, the muscle relaxants, and their blood thinner medication. If the patient is still at the rehab facility at time of the two week follow up appointment, the skilled rehab facility will also need to assist the patient in arranging follow up appointment in our office and any transportation needs. ° °MAKE SURE YOU:  °• Understand these instructions.  °• Get help right away if you are not doing well or get worse.  ° ° °Pick up stool softner and laxative for home use following surgery while on pain medications. °Do not submerge incision under water. °Please use good hand washing techniques while changing dressing each day. °May shower starting three days after surgery. °Please use a clean towel to pat the incision dry following showers. °Continue to use ice for pain and swelling after surgery. °Do not use any lotions or creams on the incision until instructed by your surgeon. ° °Information on my medicine - XARELTO® (Rivaroxaban) ° °This medication education was reviewed with me or my healthcare representative as part of my discharge preparation.  The pharmacist that spoke with me during my hospital stay was:   ° °Why was Xarelto® prescribed for you? °Xarelto® was prescribed for you to reduce the risk of blood clots forming after orthopedic surgery. The medical term  for these abnormal blood clots is venous thromboembolism (VTE). ° °What do you need to know about xarelto® ? °Take your Xarelto® ONCE DAILY at the same time every day. °You may take it either with or without food. ° °If you have difficulty swallowing the tablet whole, you may crush it and mix   in applesauce just prior to taking your dose. ° °Take Xarelto® exactly as prescribed by your doctor and DO NOT stop taking Xarelto® without talking to the doctor who prescribed the medication.  Stopping without other VTE prevention medication to take the place of Xarelto® may increase your risk of developing a clot. ° °After discharge, you should have regular check-up appointments with your healthcare provider that is prescribing your Xarelto®.   ° °What do you do if you miss a dose? °If you miss a dose, take it as soon as you remember on the same day then continue your regularly scheduled once daily regimen the next day. Do not take two doses of Xarelto® on the same day.  ° °Important Safety Information °A possible side effect of Xarelto® is bleeding. You should call your healthcare provider right away if you experience any of the following: °? Bleeding from an injury or your nose that does not stop. °? Unusual colored urine (red or dark brown) or unusual colored stools (red or black). °? Unusual bruising for unknown reasons. °? A serious fall or if you hit your head (even if there is no bleeding). ° °Some medicines may interact with Xarelto® and might increase your risk of bleeding while on Xarelto®. To help avoid this, consult your healthcare provider or pharmacist prior to using any new prescription or non-prescription medications, including herbals, vitamins, non-steroidal anti-inflammatory drugs (NSAIDs) and supplements. ° °This website has more information on Xarelto®: www.xarelto.com. ° ° ° °

## 2019-06-11 NOTE — Op Note (Signed)
OPERATIVE REPORT- TOTAL HIP ARTHROPLASTY   PREOPERATIVE DIAGNOSIS: Osteoarthritis of the Left hip.   POSTOPERATIVE DIAGNOSIS: Osteoarthritis of the Left  hip.   PROCEDURE: Left total hip arthroplasty, anterior approach.   SURGEON: Gaynelle Arabian, MD   ASSISTANT: Molli Barrows, PA-C  ANESTHESIA:  Spinal  ESTIMATED BLOOD LOSS:-100 mL    DRAINS: Hemovac x1.   COMPLICATIONS: None   CONDITION: PACU - hemodynamically stable.   BRIEF CLINICAL NOTE: Natalie Tran is a 78 y.o. female who has advanced end-  stage arthritis of their Left  hip with progressively worsening pain and  dysfunction.The patient has failed nonoperative management and presents for  total hip arthroplasty.   PROCEDURE IN DETAIL: After successful administration of spinal  anesthetic, the traction boots for the [facility] bed were placed on both  feet and the patient was placed onto the [facility] bed, boots placed into the leg  holders. The Left hip was then isolated from the perineum with plastic  drapes and prepped and draped in the usual sterile fashion. ASIS and  greater trochanter were marked and a oblique incision was made, starting  at about 1 cm lateral and 2 cm distal to the ASIS and coursing towards  the anterior cortex of the femur. The skin was cut with a 10 blade  through subcutaneous tissue to the level of the fascia overlying the  tensor fascia lata muscle. The fascia was then incised in line with the  incision at the junction of the anterior third and posterior 2/3rd. The  muscle was teased off the fascia and then the interval between the TFL  and the rectus was developed. The Hohmann retractor was then placed at  the top of the femoral neck over the capsule. The vessels overlying the  capsule were cauterized and the fat on top of the capsule was removed.  A Hohmann retractor was then placed anterior underneath the rectus  femoris to give exposure to the entire anterior capsule. A T-shaped   capsulotomy was performed. The edges were tagged and the femoral head  was identified.       Osteophytes are removed off the superior acetabulum.  The femoral neck was then cut in situ with an oscillating saw. Traction  was then applied to the left lower extremity utilizing the [facility]  traction. The femoral head was then removed. Retractors were placed  around the acetabulum and then circumferential removal of the labrum was  performed. Osteophytes were also removed. Reaming starts at 45 mm to  medialize and  Increased in 2 mm increments to 45 mm. We reamed in  approximately 40 degrees of abduction, 20 degrees anteversion. A 48 mm  pinnacle acetabular shell was then impacted in anatomic position under  fluoroscopic guidance with excellent purchase. We did not need to place  any additional dome screws. A 28 mm neutral + 4 marathon liner was then  placed into the acetabular shell.       The femoral lift was then placed along the lateral aspect of the femur  just distal to the vastus ridge. The leg was  externally rotated and capsule  was stripped off the inferior aspect of the femoral neck down to the  level of the lesser trochanter, this was done with electrocautery. The femur was lifted after this was performed. The  leg was then placed in an extended and adducted position essentially delivering the femur. We also removed the capsule superiorly and the piriformis from the piriformis  fossa to gain excellent exposure of the  proximal femur. Rongeur was used to remove some cancellous bone to get  into the lateral portion of the proximal femur for placement of the  initial starter reamer. The starter broaches was placed  the starter broach  and was shown to go down the center of the canal. Broaching  with the Actis system was then performed starting at size 0  coursing  Up to size 6. A size 6 had excellent torsional and rotational  and axial stability. The trial standard offset neck was then  placed  with a 28 + 1.5 trial head. The hip was then reduced. We confirmed that  the stem was in the canal both on AP and lateral x-rays. It also has excellent sizing. The hip was reduced with outstanding stability through full extension and full external rotation.. AP pelvis was taken and the leg lengths were measured and found to be equal. Hip was then dislocated again and the femoral head and neck removed. The  femoral broach was removed. Size 6 Actis stem with a standard offset  neck was then impacted into the femur following native anteversion. Has  excellent purchase in the canal. Excellent torsional and rotational and  axial stability. It is confirmed to be in the canal on AP and lateral  fluoroscopic views. The 28 + 1.5 metal head was placed and the hip  reduced with outstanding stability. Again AP pelvis was taken and it  confirmed that the leg lengths were equal. The wound was then copiously  irrigated with saline solution and the capsule reattached and repaired  with Ethibond suture. 30 ml of .25% Bupivicaine was  injected into the capsule and into the edge of the tensor fascia lata as well as subcutaneous tissue. The fascia overlying the tensor fascia lata was then closed with a running #1 V-Loc. Subcu was closed with interrupted 2-0 Vicryl and subcuticular running 4-0 Monocryl. Incision was cleaned  and dried. Steri-Strips and a bulky sterile dressing applied. Hemovac  drain was hooked to suction and then the patient was awakened and transported to  recovery in stable condition.        Please note that a surgical assistant was a medical necessity for this procedure to perform it in a safe and expeditious manner. Assistant was necessary to provide appropriate retraction of vital neurovascular structures and to prevent femoral fracture and allow for anatomic placement of the prosthesis.  Gaynelle Arabian, M.D.

## 2019-06-11 NOTE — Anesthesia Procedure Notes (Signed)
Procedure Name: MAC Date/Time: 06/11/2019 9:44 AM Performed by: Eben Burow, CRNA Pre-anesthesia Checklist: Patient identified, Emergency Drugs available, Suction available, Patient being monitored and Timeout performed Oxygen Delivery Method: Simple face mask Dental Injury: Teeth and Oropharynx as per pre-operative assessment

## 2019-06-11 NOTE — Transfer of Care (Signed)
Immediate Anesthesia Transfer of Care Note  Patient: Natalie Tran  Procedure(s) Performed: TOTAL HIP ARTHROPLASTY ANTERIOR APPROACH (Left )  Patient Location: PACU  Anesthesia Type:MAC and Spinal  Level of Consciousness: awake, alert , oriented and patient cooperative  Airway & Oxygen Therapy: Patient Spontanous Breathing and Patient connected to face mask oxygen  Post-op Assessment: Report given to RN and Post -op Vital signs reviewed and stable  Post vital signs: Reviewed and stable  Last Vitals:  Vitals Value Taken Time  BP 112/65 06/11/19 1115  Temp    Pulse 75 06/11/19 1117  Resp 18 06/11/19 1117  SpO2 100 % 06/11/19 1117  Vitals shown include unvalidated device data.  Last Pain:  Vitals:   06/11/19 0836  PainSc: 3       Patients Stated Pain Goal: 3 (63/14/97 0263)  Complications: No apparent anesthesia complications

## 2019-06-11 NOTE — Anesthesia Procedure Notes (Signed)
Spinal  Patient location during procedure: OR Start time: 06/11/2019 9:48 AM End time: 06/11/2019 9:52 AM Staffing Anesthesiologist: Albertha Ghee, MD Performed: anesthesiologist  Preanesthetic Checklist Completed: patient identified, site marked, surgical consent, pre-op evaluation, timeout performed, IV checked, risks and benefits discussed and monitors and equipment checked Spinal Block Patient position: sitting Prep: DuraPrep Patient monitoring: heart rate, cardiac monitor, continuous pulse ox and blood pressure Approach: midline Location: L3-4 Injection technique: single-shot Needle Needle type: Sprotte  Needle gauge: 24 G Needle length: 9 cm Assessment Sensory level: T4 Additional Notes Functioning IV was confirmed and monitors were applied. Sterile prep and drape, including hand hygiene and sterile gloves were used. The patient was positioned and the spine was prepped. The skin was anesthetized with lidocaine.  Free flow of clear CSF was obtained prior to injecting local anesthetic into the CSF.  The spinal needle aspirated freely following injection.  The needle was carefully withdrawn.  The patient tolerated the procedure well.

## 2019-06-11 NOTE — Anesthesia Postprocedure Evaluation (Signed)
Anesthesia Post Note  Patient: Natalie Tran  Procedure(s) Performed: TOTAL HIP ARTHROPLASTY ANTERIOR APPROACH (Left )     Patient location during evaluation: PACU Anesthesia Type: Spinal Level of consciousness: oriented and awake and alert Pain management: pain level controlled Vital Signs Assessment: post-procedure vital signs reviewed and stable Respiratory status: spontaneous breathing, respiratory function stable and patient connected to nasal cannula oxygen Cardiovascular status: blood pressure returned to baseline and stable Postop Assessment: no headache, no backache and no apparent nausea or vomiting Anesthetic complications: no    Last Vitals:  Vitals:   06/11/19 1245 06/11/19 1307  BP: 123/74 128/62  Pulse: 63 85  Resp: 12 16  Temp:  36.6 C  SpO2: 100% 100%    Last Pain:  Vitals:   06/11/19 1245  PainSc: [place] S

## 2019-06-11 NOTE — Plan of Care (Signed)

## 2019-06-11 NOTE — Anesthesia Preprocedure Evaluation (Signed)
Anesthesia Evaluation  Patient identified by MRN, date of birth, ID band Patient awake    Reviewed: Allergy & Precautions, H&P , NPO status , Patient's Chart, lab work & pertinent test results  Airway Mallampati: II   Neck ROM: full    Dental   Pulmonary neg pulmonary ROS,    breath sounds clear to auscultation       Cardiovascular negative cardio ROS   Rhythm:regular Rate:Normal     Neuro/Psych PSYCHIATRIC DISORDERS Anxiety    GI/Hepatic   Endo/Other    Renal/GU      Musculoskeletal  (+) Arthritis ,   Abdominal   Peds  Hematology   Anesthesia Other Findings   Reproductive/Obstetrics                             Anesthesia Physical Anesthesia Plan  ASA: II  Anesthesia Plan: Spinal   Post-op Pain Management:    Induction: Intravenous  PONV Risk Score and Plan: 2 and Ondansetron, Propofol infusion and Treatment may vary due to age or medical condition  Airway Management Planned: Simple Face Mask  Additional Equipment:   Intra-op Plan:   Post-operative Plan:   Informed Consent: I have reviewed the patients History and Physical, chart, labs and discussed the procedure including the risks, benefits and alternatives for the proposed anesthesia with the patient or authorized representative who has indicated his/her understanding and acceptance.       Plan Discussed with: CRNA, Anesthesiologist and Surgeon  Anesthesia Plan Comments:         Anesthesia Quick Evaluation

## 2019-06-12 ENCOUNTER — Encounter (HOSPITAL_COMMUNITY): Payer: Self-pay | Admitting: Orthopedic Surgery

## 2019-06-12 LAB — CBC
HCT: 35.8 % — ABNORMAL LOW (ref 36.0–46.0)
Hemoglobin: 11.2 g/dL — ABNORMAL LOW (ref 12.0–15.0)
MCH: 30.8 pg (ref 26.0–34.0)
MCHC: 31.3 g/dL (ref 30.0–36.0)
MCV: 98.4 fL (ref 80.0–100.0)
Platelets: 148 10*3/uL — ABNORMAL LOW (ref 150–400)
RBC: 3.64 MIL/uL — ABNORMAL LOW (ref 3.87–5.11)
RDW: 13.4 % (ref 11.5–15.5)
WBC: 10 10*3/uL (ref 4.0–10.5)
nRBC: 0 % (ref 0.0–0.2)

## 2019-06-12 LAB — BASIC METABOLIC PANEL
Anion gap: 7 (ref 5–15)
BUN: 11 mg/dL (ref 8–23)
CO2: 25 mmol/L (ref 22–32)
Calcium: 9 mg/dL (ref 8.9–10.3)
Chloride: 108 mmol/L (ref 98–111)
Creatinine, Ser: 0.71 mg/dL (ref 0.44–1.00)
GFR calc Af Amer: >60 mL/min (ref >60)
GFR calc non Af Amer: >60 mL/min (ref >60)
Glucose, Bld: 144 mg/dL — ABNORMAL HIGH (ref 70–99)
Potassium: 4.5 mmol/L (ref 3.5–5.1)
Sodium: 140 mmol/L (ref 135–145)

## 2019-06-12 MED ORDER — OXYCODONE HCL 5 MG PO TABS: 5.0000 mg | ORAL_TABLET | Freq: Four times a day (QID) | ORAL | 0 refills | Status: DC | PRN

## 2019-06-12 MED ORDER — TRAMADOL HCL 50 MG PO TABS: 50.0000 mg | ORAL_TABLET | Freq: Four times a day (QID) | ORAL | 0 refills | Status: DC | PRN

## 2019-06-12 MED ORDER — METHOCARBAMOL 500 MG PO TABS: 500.0000 mg | ORAL_TABLET | Freq: Four times a day (QID) | ORAL | 0 refills | Status: DC | PRN

## 2019-06-12 MED ORDER — RIVAROXABAN 10 MG PO TABS: 10.0000 mg | ORAL_TABLET | Freq: Every day | ORAL | 0 refills | Status: DC

## 2019-06-12 NOTE — Progress Notes (Signed)
Physical Therapy Treatment Patient Details Name: Natalie Tran MRN: [id number] DOB: [birth year] Today's Date: 06/12/2019    History of Present Illness Patient is 78 y.o. female s/p Lt THA with PMH significant for arthritis and past Rt THA.    PT Comments    Pt motivated and progressing well with mobility.  Pt reviewed car transfers, stairs and home therex program with written instruction provided and reviewed.   Follow Up Recommendations  Follow surgeon's recommendation for DC plan and follow-up therapies     Equipment Recommendations  Rolling walker with 5" wheels    Recommendations for Other Services       Precautions / Restrictions Precautions Precautions: Fall Restrictions Weight Bearing Restrictions: No    Mobility  Bed Mobility Overal bed mobility: Needs Assistance Bed Mobility: Supine to Sit;Sit to Supine     Supine to sit: Supervision Sit to supine: Min guard   General bed mobility comments: cues for sequencing, no use of bed rails  Transfers Overall transfer level: Needs assistance Equipment used: Rolling walker (2 wheeled) Transfers: Sit to/from Stand Sit to Stand: Min guard;Supervision         General transfer comment: cues for LE management and use of UEs to self assist  Ambulation/Gait Ambulation/Gait assistance: Min guard;Supervision Gait Distance (Feet): 200 Feet Assistive device: Rolling walker (2 wheeled) Gait Pattern/deviations: Step-through pattern;Decreased step length - right;Decreased step length - left;Decreased stride length;Shuffle Gait velocity: decr   General Gait Details: cues for posture, position from RW and initial sequence   Stairs Stairs: Yes Stairs assistance: Min assist Stair Management: No rails;Step to pattern;Forwards;With walker Number of Stairs: 2 General stair comments: RW at top of stairs; cues for sequence and assist to stability and RW management   Wheelchair Mobility    Modified Rankin (Stroke  Patients Only)       Balance Overall balance assessment: Needs assistance Sitting-balance support: No upper extremity supported;Feet supported Sitting balance-Leahy Scale: Good     Standing balance support: During functional activity;Bilateral upper extremity supported Standing balance-Leahy Scale: Fair Standing balance comment: pt able to stand without support statically however support required for gait                            Cognition Arousal/Alertness: Awake/alert Behavior During Therapy: WFL for tasks assessed/performed Overall Cognitive Status: Within Functional Limits for tasks assessed                                        Exercises Total Joint Exercises Ankle Circles/Pumps: AROM;Both;Supine;20 reps Quad Sets: AROM;Both;10 reps;Supine Heel Slides: AROM;Left;Supine;10 reps Hip ABduction/ADduction: AAROM;Left;Supine;10 reps Long Arc Quad: Left;AROM;AAROM;Seated;5 reps    General Comments        Pertinent Vitals/Pain Pain Assessment: 0-10 Pain Score: 4  Pain Location: Lt hip Pain Descriptors / Indicators: Tightness;Aching;Burning Pain Intervention(s): Limited activity within patient's tolerance;Monitored during session;Premedicated before session;Ice applied    Home Living                      Prior Function            PT Goals (current goals can now be found in the care plan section) Acute Rehab PT Goals Patient Stated Goal: get back to walking indepenently PT Goal Formulation: With patient Time For Goal Achievement: 06/18/19 Potential to Achieve Goals: Good Progress towards PT  goals: Progressing toward goals    Frequency    7X/week      PT Plan Current plan remains appropriate    Co-evaluation              AM-PAC PT "6 Clicks" Mobility   Outcome Measure  Help needed turning from your back to your side while in a flat bed without using bedrails?: A Little Help needed moving from lying on your  back to sitting on the side of a flat bed without using bedrails?: A Little Help needed moving to and from a bed to a chair (including a wheelchair)?: A Little Help needed standing up from a chair using your arms (e.g., wheelchair or bedside chair)?: A Little Help needed to walk in hospital room?: A Little Help needed climbing 3-5 steps with a railing? : A Little 6 Click Score: 18    End of Session Equipment Utilized During Treatment: Gait belt Activity Tolerance: Patient tolerated treatment well Patient left: with call bell/phone within reach;in bed Nurse Communication: Mobility status PT Visit Diagnosis: Muscle weakness (generalized) (M62.81);Difficulty in walking, not elsewhere classified (R26.2)     Time: 1326-1401 PT Time Calculation (min) (ACUTE ONLY): 35 min  Charges:  $Gait Training: 8-22 mins $Therapeutic Exercise: 8-22 mins                     Debe Coder PT Acute Rehabilitation Services Pager [phone] Office [phone]    Diamon Reddinger 06/12/2019, 2:46 PM

## 2019-06-12 NOTE — Discharge Summary (Signed)
Physician Discharge Summary   Patient ID: Natalie Tran MRN: [id number] DOB/AGE: 08-30-1940 78 y.o.  Admit date: 06/11/2019 Discharge date: 06/12/2019  Primary Diagnosis: Osteoarthritis, left hip   Admission Diagnoses:  Past Medical History:  Diagnosis Date   Anxiety    because of first surgery   Arthritis    Cancer ([place])    hx of skin cancer right cheek   Discharge Diagnoses:   Active Problems:   OA (osteoarthritis) of hip  Estimated body mass index is 22.66 kg/m as calculated from the following:   Height as of this encounter: 5\' 4"  (1.626 m).   Weight as of this encounter: 59.9 kg.  Procedure:  Procedure(s) (LRB): TOTAL HIP ARTHROPLASTY ANTERIOR APPROACH (Left)   Consults: None  HPI: Natalie Tran is a 78 y.o. female who has advanced end-  stage arthritis of their Left  hip with progressively worsening pain and  dysfunction.The patient has failed nonoperative management and presents for  total hip arthroplasty.   Laboratory Data: Admission on 06/11/2019, Discharged on 06/12/2019  Component Date Value Ref Range Status   WBC 06/12/2019 10.0  4.0 - 10.5 K/uL Final   RBC 06/12/2019 3.64* 3.87 - 5.11 MIL/uL Final   Hemoglobin 06/12/2019 11.2* 12.0 - 15.0 g/dL Final   HCT 06/12/2019 35.8* 36.0 - 46.0 % Final   MCV 06/12/2019 98.4  80.0 - 100.0 fL Final   MCH 06/12/2019 30.8  26.0 - 34.0 pg Final   MCHC 06/12/2019 31.3  30.0 - 36.0 g/dL Final   RDW 06/12/2019 13.4  11.5 - 15.5 % Final   Platelets 06/12/2019 148* 150 - 400 K/uL Final   nRBC 06/12/2019 0.0  0.0 - 0.2 % Final   Performed at [facility], [place] [address] [place], [place] [address]   Sodium 06/12/2019 140  135 - 145 mmol/L Final   Potassium 06/12/2019 4.5  3.5 - 5.1 mmol/L Final   Chloride 06/12/2019 108  98 - 111 mmol/L Final   CO2 06/12/2019 25  22 - 32 mmol/L Final   Glucose, Bld 06/12/2019 144* 70 - 99 mg/dL Final   BUN 06/12/2019 11  8 - 23 mg/dL Final    Creatinine, Ser 06/12/2019 0.71  0.44 - 1.00 mg/dL Final   Calcium 06/12/2019 9.0  8.9 - 10.3 mg/dL Final   GFR calc non Af Amer 06/12/2019 >60  >60 mL/min Final   GFR calc Af Amer 06/12/2019 >60  >60 mL/min Final   Anion gap 06/12/2019 7  5 - 15 Final   Performed at [facility], [place] [address] [place], [place] [address]  Hospital Outpatient Visit on 06/07/2019  Component Date Value Ref Range Status   SARS-CoV-2, NAA 06/07/2019 NOT DETECTED  NOT DETECTED Final   Comment: (NOTE) This nucleic acid amplification test was developed and its performance characteristics determined by [employer]. Nucleic acid amplification tests include PCR and TMA. This test has not been FDA cleared or approved. This test has been authorized by FDA under an Emergency Use Authorization (EUA). This test is only authorized for the duration of time the declaration that circumstances exist justifying the authorization of the emergency use of in vitro diagnostic tests for detection of SARS-CoV-2 virus and/or diagnosis of COVID-19 infection under section 564(b)(1) of the Act, 21 U.S.C. [id number]) (1), unless the authorization is terminated or revoked sooner. When diagnostic testing is negative, the possibility of a false negative result should be considered in the context of a patient's recent exposures  and the presence of clinical signs and symptoms consistent with COVID-19. An individual without symptoms of COVID- 19 and who is not shedding SARS-CoV-2 vi                          rus would expect to have a negative (not detected) result in this assay. Performed At: [facility] [place], [place] [id number] Rush Farmer MD [id number]    Coronavirus Source 06/07/2019 NASOPHARYNGEAL   Final   Performed at [facility], [place] [address], [place], [place] [address]  Hospital Outpatient Visit on 06/03/2019  Component Date Value Ref Range Status    aPTT 06/03/2019 31  24 - 36 seconds Final   Performed at [facility], [place] [address] [place], [place] [address]   WBC 06/03/2019 4.9  4.0 - 10.5 K/uL Final   RBC 06/03/2019 4.51  3.87 - 5.11 MIL/uL Final   Hemoglobin 06/03/2019 13.8  12.0 - 15.0 g/dL Final   HCT 06/03/2019 43.2  36.0 - 46.0 % Final   MCV 06/03/2019 95.8  80.0 - 100.0 fL Final   MCH 06/03/2019 30.6  26.0 - 34.0 pg Final   MCHC 06/03/2019 31.9  30.0 - 36.0 g/dL Final   RDW 06/03/2019 13.3  11.5 - 15.5 % Final   Platelets 06/03/2019 190  150 - 400 K/uL Final   nRBC 06/03/2019 0.0  0.0 - 0.2 % Final   Performed at [facility], [place] [address], [place], [place] [address]   Sodium 06/03/2019 141  135 - 145 mmol/L Final   Potassium 06/03/2019 4.5  3.5 - 5.1 mmol/L Final   Chloride 06/03/2019 105  98 - 111 mmol/L Final   CO2 06/03/2019 27  22 - 32 mmol/L Final   Glucose, Bld 06/03/2019 91  70 - 99 mg/dL Final   BUN 06/03/2019 16  8 - 23 mg/dL Final   Creatinine, Ser 06/03/2019 0.81  0.44 - 1.00 mg/dL Final   Calcium 06/03/2019 9.8  8.9 - 10.3 mg/dL Final   Total Protein 06/03/2019 7.8  6.5 - 8.1 g/dL Final   Albumin 06/03/2019 4.6  3.5 - 5.0 g/dL Final   AST 06/03/2019 25  15 - 41 U/L Final   ALT 06/03/2019 20  0 - 44 U/L Final   Alkaline Phosphatase 06/03/2019 78  38 - 126 U/L Final   Total Bilirubin 06/03/2019 0.9  0.3 - 1.2 mg/dL Final   GFR calc non Af Amer 06/03/2019 >60  >60 mL/min Final   GFR calc Af Amer 06/03/2019 >60  >60 mL/min Final   Anion gap 06/03/2019 9  5 - 15 Final   Performed at [facility], [place] [address] [place], [place] [address]   Prothrombin Time 06/03/2019 12.0  11.4 - 15.2 seconds Final   INR 06/03/2019 0.9  0.8 - 1.2 Final   Comment: (NOTE) INR goal varies based on device and disease states. Performed at [facility], [place] [address] [place],  [address]    ABO/RH(D)  06/03/2019 O POS   Final   Antibody Screen 06/03/2019 NEG   Final   Sample Expiration 06/03/2019 06/14/2019,2359   Final   Extend sample reason 06/03/2019    Final                   Value:NO TRANSFUSIONS OR PREGNANCY IN THE PAST 3 MONTHS Performed at [place]  [address], [place], [place] [address]    MRSA, PCR 06/03/2019 NEGATIVE  NEGATIVE Final   Staphylococcus aureus 06/03/2019 NEGATIVE  NEGATIVE Final   Comment: (NOTE) The Xpert SA Assay (FDA approved for NASAL specimens in patients 49 years of age and older), is one component of a comprehensive surveillance program. It is not intended to diagnose infection nor to guide or monitor treatment. Performed at [facility], [place] [address] [place], [place] [address]      X-Rays:Dg Pelvis Portable  Result Date: 06/11/2019 CLINICAL DATA:  Postoperative left total hip arthroplasty radiograph EXAM: PORTABLE PELVIS 1-2 VIEWS COMPARISON:  Intraoperative hip radiographs from the same day as well as pelvic radiographs dated 11/22/2016. FINDINGS: The patient is status post a total left arthroplasty. The hardware appears intact and well aligned. A surgical drain overlies the operative site. A right hip total arthroplasty is also noted. IMPRESSION: Normal postoperative appearance after total left hip arthroplasty. No evidence of hardware complication. Electronically Signed   By: Zerita Boers M.D.   On: 06/11/2019 14:01   Dg C-arm 1-60 Min-no Report  Result Date: 06/11/2019 Fluoroscopy was utilized by the requesting physician.  No radiographic interpretation.   Dg Hip Operative Unilat With Pelvis Left  Result Date: 06/11/2019 CLINICAL DATA:  Left hip replacement EXAM: OPERATIVE LEFT HIP (WITH PELVIS IF PERFORMED) 6 VIEWS TECHNIQUE: Fluoroscopic spot image(s) were submitted for interpretation post-operatively. COMPARISON:  11/22/2016 FINDINGS: Remote right hip replacement. Intraoperative spot  images showed interval left hip replacement. Normal AP alignment. No hardware bony complicating feature. IMPRESSION: Left hip replacement.  No complicating feature. Electronically Signed   By: Rolm Baptise M.D.   On: 06/11/2019 12:27    EKG:No orders found for this or any previous visit.   Hospital Course: Natalie Tran is a 78 y.o. who was admitted to [facility]. They were brought to the operating room on 06/11/2019 and underwent Procedure(s): [place].  Patient tolerated the procedure well and was later transferred to the recovery room and then to the orthopaedic floor for postoperative care. They were given PO and IV analgesics for pain control following their surgery. They were given 24 hours of postoperative antibiotics of  Anti-infectives (From admission, onward)   Start     Dose/Rate Route Frequency Ordered Stop   06/11/19 1600  ceFAZolin (ANCEF) IVPB 1 g/50 mL premix     1 g 100 mL/hr over 30 Minutes Intravenous Every 6 hours 06/11/19 1312 06/12/19 0359   06/11/19 0815  ceFAZolin (ANCEF) IVPB 2g/100 mL premix     2 g 200 mL/hr over 30 Minutes Intravenous On call to O.R. 06/11/19 0810 06/11/19 0952   06/11/19 0813  ceFAZolin (ANCEF) 2-4 GM/100ML-% IVPB    Note to Pharmacy: Randa Evens  : cabinet override      06/11/19 0813 06/11/19 0952     and started on DVT prophylaxis in the form of Xarelto.   PT and OT were ordered for total joint protocol. Discharge planning consulted to help with postop disposition and equipment needs.  Patient had a good night on the evening of surgery. They started to get up OOB with therapy on POD #0. Pt was seen during rounds and was ready to go home pending progress with therapy. Hemovac drain was pulled without difficulty. She worked with therapy on POD #1 and was meeting her goals. Pt was discharged to home later that day in stable condition.  Diet: Regular diet Activity: WBAT Follow-up: in  2  weeks Disposition: Home Discharged Condition: good   Discharge Instructions    Call MD / Call 911   Complete by: As directed    If you experience chest pain or shortness of breath, CALL 911 and be transported to the hospital emergency room.  If you develope a fever above 101 F, pus (white drainage) or increased drainage or redness at the wound, or calf pain, call your surgeon's office.   Change dressing   Complete by: As directed    You may change your dressing on Friday, then change the dressing daily with sterile 4 x 4 inch gauze dressing and paper tape.   Constipation Prevention   Complete by: As directed    Drink plenty of fluids.  Prune juice may be helpful.  You may use a stool softener, such as Colace (over the counter) 100 mg twice a day.  Use MiraLax (over the counter) for constipation as needed.   Diet - low sodium heart healthy   Complete by: As directed    Discharge instructions   Complete by: As directed    Dr. Gaynelle Arabian Total Joint Specialist [facility] [address], [place], [place] [address] [phone]  ANTERIOR APPROACH TOTAL HIP REPLACEMENT POSTOPERATIVE DIRECTIONS   Hip Rehabilitation, Guidelines Following Surgery  The results of a hip operation are greatly improved after range of motion and muscle strengthening exercises. Follow all safety measures which are given to protect your hip. If any of these exercises cause increased pain or swelling in your joint, decrease the amount until you are comfortable again. Then slowly increase the exercises. Call your caregiver if you have problems or questions.   HOME CARE INSTRUCTIONS  Remove items at home which could result in a fall. This includes throw rugs or furniture in walking pathways.  ICE to the affected hip every three hours for 30 minutes at a time and then as needed for pain and swelling.  Continue to use ice on the hip for pain and swelling from surgery. You may notice swelling that will  progress down to the foot and ankle.  This is normal after surgery.  Elevate the leg when you are not up walking on it.   Continue to use the breathing machine which will help keep your temperature down.  It is common for your temperature to cycle up and down following surgery, especially at night when you are not up moving around and exerting yourself.  The breathing machine keeps your lungs expanded and your temperature down.  DIET You may resume your previous home diet once your are discharged from the hospital.  DRESSING / WOUND CARE / SHOWERING You may change your dressing 3-5 days after surgery.  Then change the dressing every day with sterile gauze.  Please use good hand washing techniques before changing the dressing.  Do not use any lotions or creams on the incision until instructed by your surgeon. You may start showering once you are discharged home but do not submerge the incision under water. Just pat the incision dry and apply a dry gauze dressing on daily. Change the surgical dressing daily and reapply a dry dressing each time.  ACTIVITY Walk with your walker as instructed. Use walker as long as suggested by your caregivers. Avoid periods of inactivity such as sitting longer than an hour when not asleep. This helps prevent blood clots.  You may resume a sexual relationship in one month or when given the OK by your doctor.  You may return to work once you are cleared by your doctor.  Do not drive a car for 6 weeks or until released by you surgeon.  Do not drive while taking narcotics.  WEIGHT BEARING Weight bearing as tolerated with assist device (walker, cane, etc) as directed, use it as long as suggested by your surgeon or therapist, typically at least 4-6 weeks.  POSTOPERATIVE CONSTIPATION PROTOCOL Constipation - defined medically as fewer than three stools per week and severe constipation as less than one stool per week.  One of the most common issues patients have  following surgery is constipation.  Even if you have a regular bowel pattern at home, your normal regimen is likely to be disrupted due to multiple reasons following surgery.  Combination of anesthesia, postoperative narcotics, change in appetite and fluid intake all can affect your bowels.  In order to avoid complications following surgery, here are some recommendations in order to help you during your recovery period.  Colace (docusate) - Pick up an over-the-counter form of Colace or another stool softener and take twice a day as long as you are requiring postoperative pain medications.  Take with a full glass of water daily.  If you experience loose stools or diarrhea, hold the colace until you stool forms back up.  If your symptoms do not get better within 1 week or if they get worse, check with your doctor.  Dulcolax (bisacodyl) - Pick up over-the-counter and take as directed by the product packaging as needed to assist with the movement of your bowels.  Take with a full glass of water.  Use this product as needed if not relieved by Colace only.   MiraLax (polyethylene glycol) - Pick up over-the-counter to have on hand.  MiraLax is a solution that will increase the amount of water in your bowels to assist with bowel movements.  Take as directed and can mix with a glass of water, juice, soda, coffee, or tea.  Take if you go more than two days without a movement. Do not use MiraLax more than once per day. Call your doctor if you are still constipated or irregular after using this medication for 7 days in a row.  If you continue to have problems with postoperative constipation, please contact the office for further assistance and recommendations.  If you experience "the worst abdominal pain ever" or develop nausea or vomiting, please contact the office immediatly for further recommendations for treatment.  ITCHING  If you experience itching with your medications, try taking only a single pain pill, or  even half a pain pill at a time.  You can also use Benadryl over the counter for itching or also to help with sleep.   TED HOSE STOCKINGS Wear the elastic stockings on both legs for three weeks following surgery during the day but you may remove then at night for sleeping.  MEDICATIONS See your medication summary on the "After Visit Summary" that the nursing staff will review with you prior to discharge.  You may have some home medications which will be placed on hold until you complete the course of blood thinner medication.  It is important for you to complete the blood thinner medication as prescribed by your surgeon.  Continue your approved medications as instructed at time of discharge.  PRECAUTIONS If you experience chest pain or shortness of breath - call 911 immediately for transfer to the hospital emergency department.  If you develop a fever greater that 101 F, purulent drainage  from wound, increased redness or drainage from wound, foul odor from the wound/dressing, or calf pain - CONTACT YOUR SURGEON.                                                   FOLLOW-UP APPOINTMENTS Make sure you keep all of your appointments after your operation with your surgeon and caregivers. You should call the office at the above phone number and make an appointment for approximately two weeks after the date of your surgery or on the date instructed by your surgeon outlined in the "After Visit Summary".  RANGE OF MOTION AND STRENGTHENING EXERCISES  These exercises are designed to help you keep full movement of your hip joint. Follow your caregiver's or physical therapist's instructions. Perform all exercises about fifteen times, three times per day or as directed. Exercise both hips, even if you have had only one joint replacement. These exercises can be done on a training (exercise) mat, on the floor, on a table or on a bed. Use whatever works the best and is most comfortable for you. Use music or television  while you are exercising so that the exercises are a pleasant break in your day. This will make your life better with the exercises acting as a break in routine you can look forward to.  Lying on your back, slowly slide your foot toward your buttocks, raising your knee up off the floor. Then slowly slide your foot back down until your leg is straight again.  Lying on your back spread your legs as far apart as you can without causing discomfort.  Lying on your side, raise your upper leg and foot straight up from the floor as far as is comfortable. Slowly lower the leg and repeat.  Lying on your back, tighten up the muscle in the front of your thigh (quadriceps muscles). You can do this by keeping your leg straight and trying to raise your heel off the floor. This helps strengthen the largest muscle supporting your knee.  Lying on your back, tighten up the muscles of your buttocks both with the legs straight and with the knee bent at a comfortable angle while keeping your heel on the floor.   IF YOU ARE TRANSFERRED TO A SKILLED REHAB FACILITY If the patient is transferred to a skilled rehab facility following release from the hospital, a list of the current medications will be sent to the facility for the patient to continue.  When discharged from the skilled rehab facility, please have the facility set up the patient's [place] prior to being released. Also, the skilled facility will be responsible for providing the patient with their medications at time of release from the facility to include their pain medication, the muscle relaxants, and their blood thinner medication. If the patient is still at the rehab facility at time of the two week follow up appointment, the skilled rehab facility will also need to assist the patient in arranging follow up appointment in our office and any transportation needs.  MAKE SURE YOU:  Understand these instructions.  Get help right away if you are  not doing well or get worse.    Pick up stool softner and laxative for home use following surgery while on pain medications. Do not submerge incision under water. Please use good hand washing techniques while  changing dressing each day. May shower starting three days after surgery. Please use a clean towel to pat the incision dry following showers. Continue to use ice for pain and swelling after surgery. Do not use any lotions or creams on the incision until instructed by your surgeon.   Do not sit on low chairs, stoools or toilet seats, as it may be difficult to get up from low surfaces   Complete by: As directed    Driving restrictions   Complete by: As directed    No driving for two weeks   TED hose   Complete by: As directed    Use stockings (TED hose) for three weeks on both leg(s).  You may remove them at night for sleeping.   Weight bearing as tolerated   Complete by: As directed      Allergies as of 06/12/2019      Reactions   Asa [aspirin]    Pt states "makes stomach raw"  Pt can take motrin   Sulfa Antibiotics Other (See Comments)   unknown      Medication List    STOP taking these medications   naproxen sodium 220 MG tablet Commonly known as: ALEVE   oxyCODONE-acetaminophen 7.5-325 MG tablet Commonly known as: PERCOCET     TAKE these medications   methocarbamol 500 MG tablet Commonly known as: ROBAXIN Take 1 tablet (500 mg total) by mouth every 6 (six) hours as needed for muscle spasms.   oxyCODONE 5 MG immediate release tablet Commonly known as: Oxy IR/ROXICODONE Take 1-2 tablets (5-10 mg total) by mouth every 6 (six) hours as needed for severe pain.   rivaroxaban 10 MG Tabs tablet Commonly known as: XARELTO Take 1 tablet (10 mg total) by mouth daily with breakfast for 20 days. Take one tablet Xarelto once a day for three weeks following surgery. Then change to one baby Aspirin (81 mg) once a day for three weeks. Then discontinue Aspirin. Start taking  on: June 13, 2019   traMADol 50 MG tablet Commonly known as: ULTRAM Take 1-2 tablets (50-100 mg total) by mouth every 6 (six) hours as needed for moderate pain.            Durable Medical Equipment  (From admission, onward)         Start     Ordered   06/12/19 1543  For home use only DME Walker rolling  Once    Question:  Patient needs a walker to treat with the following condition  Answer:  Status post total hip replacement, left   06/12/19 1543           Discharge Care Instructions  (From admission, onward)         Start     Ordered   06/12/19 0000  Weight bearing as tolerated     06/12/19 0951   06/12/19 0000  Change dressing    Comments: You may change your dressing on Friday, then change the dressing daily with sterile 4 x 4 inch gauze dressing and paper tape.   06/12/19 [id number]         Follow-up Information    Gaynelle Arabian, MD. Go on 06/24/2019.   Specialty: Orthopedic Surgery Why: You are scheduled for a post-operative appointment on 06-24-19 at 1:00 pm.  Contact information: [address] [place] [address] [id number]           Signed: Griffith Citron, PA-C Orthopedic Surgery 06/12/2019, 5:13 PM

## 2019-06-12 NOTE — Progress Notes (Signed)
Physical Therapy Treatment Patient Details Name: Natalie Tran MRN: [id number] DOB: 25-Jun-1941 Today's Date: 06/12/2019    History of Present Illness Patient is 78 y.o. female s/p Lt THA with PMH significant for arthritis and past Rt THA.    PT Comments    Pt progressing well with mobility and hopeful for dc home this pm   Follow Up Recommendations  Follow surgeon's recommendation for DC plan and follow-up therapies     Equipment Recommendations  Rolling walker with 5" wheels    Recommendations for Other Services       Precautions / Restrictions Precautions Precautions: Fall Restrictions Weight Bearing Restrictions: No    Mobility  Bed Mobility Overal bed mobility: Needs Assistance Bed Mobility: Supine to Sit     Supine to sit: Supervision     General bed mobility comments: cues for sequencing, no use of bed rails  Transfers Overall transfer level: Needs assistance Equipment used: Rolling walker (2 wheeled) Transfers: Sit to/from Stand Sit to Stand: Min guard         General transfer comment: cues for LE management and use of UEs to self assist  Ambulation/Gait Ambulation/Gait assistance: Min guard Gait Distance (Feet): 200 Feet Assistive device: Rolling walker (2 wheeled) Gait Pattern/deviations: Step-through pattern;Decreased step length - right;Decreased step length - left;Decreased stride length;Shuffle Gait velocity: decr   General Gait Details: cues for posture, position from RW and initial sequence   Stairs             Wheelchair Mobility    Modified Rankin (Stroke Patients Only)       Balance Overall balance assessment: Needs assistance Sitting-balance support: No upper extremity supported;Feet supported Sitting balance-Leahy Scale: Good     Standing balance support: During functional activity;Bilateral upper extremity supported Standing balance-Leahy Scale: Fair Standing balance comment: pt able to stand without support  statically however support required for gait                            Cognition Arousal/Alertness: Awake/alert Behavior During Therapy: WFL for tasks assessed/performed Overall Cognitive Status: Within Functional Limits for tasks assessed                                        Exercises Total Joint Exercises Ankle Circles/Pumps: AROM;Both;Supine;20 reps Quad Sets: AROM;Both;10 reps;Supine Heel Slides: AROM;Left;20 reps;Supine Hip ABduction/ADduction: AAROM;Left;15 reps;Supine Long Arc Quad: Left;AROM;AAROM;10 reps;Seated    General Comments        Pertinent Vitals/Pain Pain Assessment: 0-10 Pain Score: 5  Pain Location: Lt hip Pain Descriptors / Indicators: Tightness;Aching;Burning Pain Intervention(s): Limited activity within patient's tolerance;Monitored during session;Premedicated before session;Ice applied    Home Living                      Prior Function            PT Goals (current goals can now be found in the care plan section) Acute Rehab PT Goals Patient Stated Goal: get back to walking indepenently PT Goal Formulation: With patient Time For Goal Achievement: 06/18/19 Potential to Achieve Goals: Good Progress towards PT goals: Progressing toward goals    Frequency    7X/week      PT Plan Current plan remains appropriate    Co-evaluation              AM-PAC  PT "6 Clicks" Mobility   Outcome Measure  Help needed turning from your back to your side while in a flat bed without using bedrails?: A Little Help needed moving from lying on your back to sitting on the side of a flat bed without using bedrails?: A Little Help needed moving to and from a bed to a chair (including a wheelchair)?: A Little Help needed standing up from a chair using your arms (e.g., wheelchair or bedside chair)?: A Little Help needed to walk in hospital room?: A Little Help needed climbing 3-5 steps with a railing? : A Little 6  Click Score: 18    End of Session Equipment Utilized During Treatment: Gait belt Activity Tolerance: Patient tolerated treatment well Patient left: in chair;with call bell/phone within reach;with chair alarm set Nurse Communication: Mobility status PT Visit Diagnosis: Muscle weakness (generalized) (M62.81);Difficulty in walking, not elsewhere classified (R26.2)     Time: [id number] PT Time Calculation (min) (ACUTE ONLY): 32 min  Charges:  $Gait Training: 8-22 mins $Therapeutic Exercise: 8-22 mins                     Debe Coder PT Acute Rehabilitation Services Pager [phone] Office [phone]    Natalie Tran 06/12/2019, 12:24 PM

## 2019-06-12 NOTE — TOC Transition Note (Signed)
Transition of Care [facility]) - CM/SW Discharge Note   Patient Details  Name: Natalie Tran MRN: [id number] Date of Birth: 04/07/1941  Transition of Care [facility]) CM/SW Contact:  Leeroy Cha, RN Phone Number: 06/12/2019, 4:30 PM   Clinical Narrative:    Rolling walker to be delivered to the home         Patient Goals and CMS Choice        Discharge Placement                       Discharge Plan and Services     Post Acute Care Choice: Durable Medical Equipment          DME Arranged: Gilford Rile rolling DME Agency: [employer] Date DME Agency Contacted: 06/12/19 Time DME Agency Contacted: 63 Representative spoke with at DME Agency: zack HH Arranged: NA [address] Agency: NA        Social Determinants of Health ([place]) Interventions     Readmission Risk Interventions No flowsheet data found.

## 2019-06-12 NOTE — Progress Notes (Signed)
   Subjective: 1 Day Post-Op Procedure(s) (LRB): TOTAL HIP ARTHROPLASTY ANTERIOR APPROACH (Left) Patient reports pain as mild.   Patient seen in rounds by Dr. Wynelle Link. Patient is well, and has had no acute complaints or problems other than discomfort in the left hip. No acute events overnight. She was able to ambulate 100 feet yesterday with PT.  Patient states she is ready to go home today. Denies CP, SHOB. We will continue therapy today.   Objective: Vital signs in last 24 hours: Temp:  [97.4 F (36.3 C)-98.6 F (37 C)] 97.8 F (36.6 C) (10/15 0537) Pulse Rate:  [62-85] 67 (10/15 0537) Resp:  [12-20] 18 (10/15 0537) BP: (108-132)/(51-76) 108/51 (10/15 0537) SpO2:  [92 %-100 %] 92 % (10/15 0537)  Intake/Output from previous day:  Intake/Output Summary (Last 24 hours) at 06/12/2019 0947 Last data filed at 06/12/2019 0900 Gross per 24 hour  Intake 3056.67 ml  Output 2652 ml  Net 404.67 ml     Intake/Output this shift: Total I/O In: 120 [P.O.:120] Out: 300 [Urine:300]  Labs: Recent Labs    06/12/19 0232  HGB 11.2*   Recent Labs    06/12/19 0232  WBC 10.0  RBC 3.64*  HCT 35.8*  PLT 148*   Recent Labs    06/12/19 0232  NA 140  K 4.5  CL 108  CO2 25  BUN 11  CREATININE 0.71  GLUCOSE 144*  CALCIUM 9.0   No results for input(s): LABPT, INR in the last 72 hours.  Exam: General - Patient is Alert and Oriented Extremity - Neurologically intact Sensation intact distally Intact pulses distally Dorsiflexion/Plantar flexion intact Dressing - dressing C/D/I Motor Function - intact, moving foot and toes well on exam.   Past Medical History:  Diagnosis Date  . 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 Arthritis   . Cancer (HCC)    hx of skin cancer right cheek    Assessment/Plan: 1 Day Post-Op Procedure(s) (LRB): TOTAL HIP ARTHROPLASTY ANTERIOR APPROACH (Left) Active Problems:   OA (osteoarthritis) of hip  Estimated body mass index is 22.66 kg/m as  calculated from the following:   Height as of this encounter: 5\' 4"  (1.626 m).   Weight as of this encounter: 59.9 kg. Advance diet Up with therapy D/C IV fluids  DVT Prophylaxis - Xarelto Weight bearing as tolerated. D/C O2 and pulse ox and try on room air. Hemovac pulled without difficulty, will begin therapy.  Plan is to go Home after hospital stay with HEP. Discharge today following 1-2 sessions of therapy as long as she continues to meet her goals. Follow up in the office in 2 weeks.   Griffith Citron, PA-C Orthopedic Surgery [phone] 06/12/2019, 9:47 AM

## 2019-06-13 DIAGNOSIS — M25552 Pain in left hip: Secondary | ICD-10-CM | POA: Diagnosis not present

## 2019-07-10 DIAGNOSIS — R300 Dysuria: Secondary | ICD-10-CM | POA: Diagnosis not present

## 2019-07-15 DIAGNOSIS — Z96642 Presence of left artificial hip joint: Secondary | ICD-10-CM | POA: Diagnosis not present

## 2019-07-15 DIAGNOSIS — Z471 Aftercare following joint replacement surgery: Secondary | ICD-10-CM | POA: Diagnosis not present

## 2019-08-06 DIAGNOSIS — M23303 Other meniscus derangements, unspecified medial meniscus, right knee: Secondary | ICD-10-CM | POA: Diagnosis not present

## 2019-08-06 DIAGNOSIS — Z6823 Body mass index (BMI) 23.0-23.9, adult: Secondary | ICD-10-CM | POA: Diagnosis not present

## 2019-09-03 DIAGNOSIS — J0101 Acute recurrent maxillary sinusitis: Secondary | ICD-10-CM | POA: Diagnosis not present

## 2019-09-08 DIAGNOSIS — Z7689 Persons encountering health services in other specified circumstances: Secondary | ICD-10-CM | POA: Diagnosis not present

## 2019-09-08 DIAGNOSIS — R8279 Other abnormal findings on microbiological examination of urine: Secondary | ICD-10-CM | POA: Diagnosis not present

## 2019-09-08 DIAGNOSIS — N3000 Acute cystitis without hematuria: Secondary | ICD-10-CM | POA: Diagnosis not present

## 2019-09-15 DIAGNOSIS — R300 Dysuria: Secondary | ICD-10-CM | POA: Diagnosis not present

## 2019-09-18 DIAGNOSIS — Z96642 Presence of left artificial hip joint: Secondary | ICD-10-CM | POA: Diagnosis not present

## 2019-09-18 DIAGNOSIS — M545 Low back pain: Secondary | ICD-10-CM | POA: Diagnosis not present

## 2019-09-23 DIAGNOSIS — M546 Pain in thoracic spine: Secondary | ICD-10-CM | POA: Diagnosis not present

## 2019-09-23 DIAGNOSIS — M545 Low back pain: Secondary | ICD-10-CM | POA: Diagnosis not present

## 2019-09-23 DIAGNOSIS — M5136 Other intervertebral disc degeneration, lumbar region: Secondary | ICD-10-CM | POA: Diagnosis not present

## 2019-09-23 DIAGNOSIS — M810 Age-related osteoporosis without current pathological fracture: Secondary | ICD-10-CM | POA: Diagnosis not present

## 2019-09-23 DIAGNOSIS — M48061 Spinal stenosis, lumbar region without neurogenic claudication: Secondary | ICD-10-CM | POA: Diagnosis not present

## 2019-10-02 DIAGNOSIS — M545 Low back pain: Secondary | ICD-10-CM | POA: Diagnosis not present

## 2019-10-02 DIAGNOSIS — M546 Pain in thoracic spine: Secondary | ICD-10-CM | POA: Diagnosis not present

## 2019-10-08 DIAGNOSIS — M5136 Other intervertebral disc degeneration, lumbar region: Secondary | ICD-10-CM | POA: Diagnosis not present

## 2019-10-08 DIAGNOSIS — M545 Low back pain: Secondary | ICD-10-CM | POA: Diagnosis not present

## 2019-10-08 DIAGNOSIS — M4854XD Collapsed vertebra, not elsewhere classified, thoracic region, subsequent encounter for fracture with routine healing: Secondary | ICD-10-CM | POA: Diagnosis not present

## 2019-10-08 DIAGNOSIS — M546 Pain in thoracic spine: Secondary | ICD-10-CM | POA: Diagnosis not present

## 2019-11-05 DIAGNOSIS — M4854XD Collapsed vertebra, not elsewhere classified, thoracic region, subsequent encounter for fracture with routine healing: Secondary | ICD-10-CM | POA: Diagnosis not present

## 2019-11-05 DIAGNOSIS — M810 Age-related osteoporosis without current pathological fracture: Secondary | ICD-10-CM | POA: Diagnosis not present

## 2019-11-05 DIAGNOSIS — M5136 Other intervertebral disc degeneration, lumbar region: Secondary | ICD-10-CM | POA: Diagnosis not present

## 2019-12-09 DIAGNOSIS — M4854XD Collapsed vertebra, not elsewhere classified, thoracic region, subsequent encounter for fracture with routine healing: Secondary | ICD-10-CM | POA: Diagnosis not present

## 2019-12-15 DIAGNOSIS — M47816 Spondylosis without myelopathy or radiculopathy, lumbar region: Secondary | ICD-10-CM | POA: Diagnosis not present

## 2019-12-15 DIAGNOSIS — Z6822 Body mass index (BMI) 22.0-22.9, adult: Secondary | ICD-10-CM | POA: Diagnosis not present

## 2019-12-15 DIAGNOSIS — S22080A Wedge compression fracture of T11-T12 vertebra, initial encounter for closed fracture: Secondary | ICD-10-CM | POA: Diagnosis not present

## 2019-12-16 DIAGNOSIS — M4854XD Collapsed vertebra, not elsewhere classified, thoracic region, subsequent encounter for fracture with routine healing: Secondary | ICD-10-CM | POA: Diagnosis not present

## 2019-12-16 DIAGNOSIS — R262 Difficulty in walking, not elsewhere classified: Secondary | ICD-10-CM | POA: Diagnosis not present

## 2019-12-18 DIAGNOSIS — M4854XD Collapsed vertebra, not elsewhere classified, thoracic region, subsequent encounter for fracture with routine healing: Secondary | ICD-10-CM | POA: Diagnosis not present

## 2019-12-18 DIAGNOSIS — R262 Difficulty in walking, not elsewhere classified: Secondary | ICD-10-CM | POA: Diagnosis not present

## 2019-12-23 DIAGNOSIS — M4854XD Collapsed vertebra, not elsewhere classified, thoracic region, subsequent encounter for fracture with routine healing: Secondary | ICD-10-CM | POA: Diagnosis not present

## 2019-12-23 DIAGNOSIS — R262 Difficulty in walking, not elsewhere classified: Secondary | ICD-10-CM | POA: Diagnosis not present

## 2019-12-25 DIAGNOSIS — R262 Difficulty in walking, not elsewhere classified: Secondary | ICD-10-CM | POA: Diagnosis not present

## 2019-12-25 DIAGNOSIS — M4854XD Collapsed vertebra, not elsewhere classified, thoracic region, subsequent encounter for fracture with routine healing: Secondary | ICD-10-CM | POA: Diagnosis not present

## 2020-01-03 IMAGING — DX DG PORTABLE PELVIS
1 series · 1 of 1 positions shown · non-contrast
Comparison: Intraoperative hip radiographs from the same day as
well as pelvic radiographs dated 11/22/2016.

CLINICAL DATA: Postoperative left total hip arthroplasty radiograph

EXAM:
PORTABLE PELVIS 1-2 VIEWS

[pelvis ap]
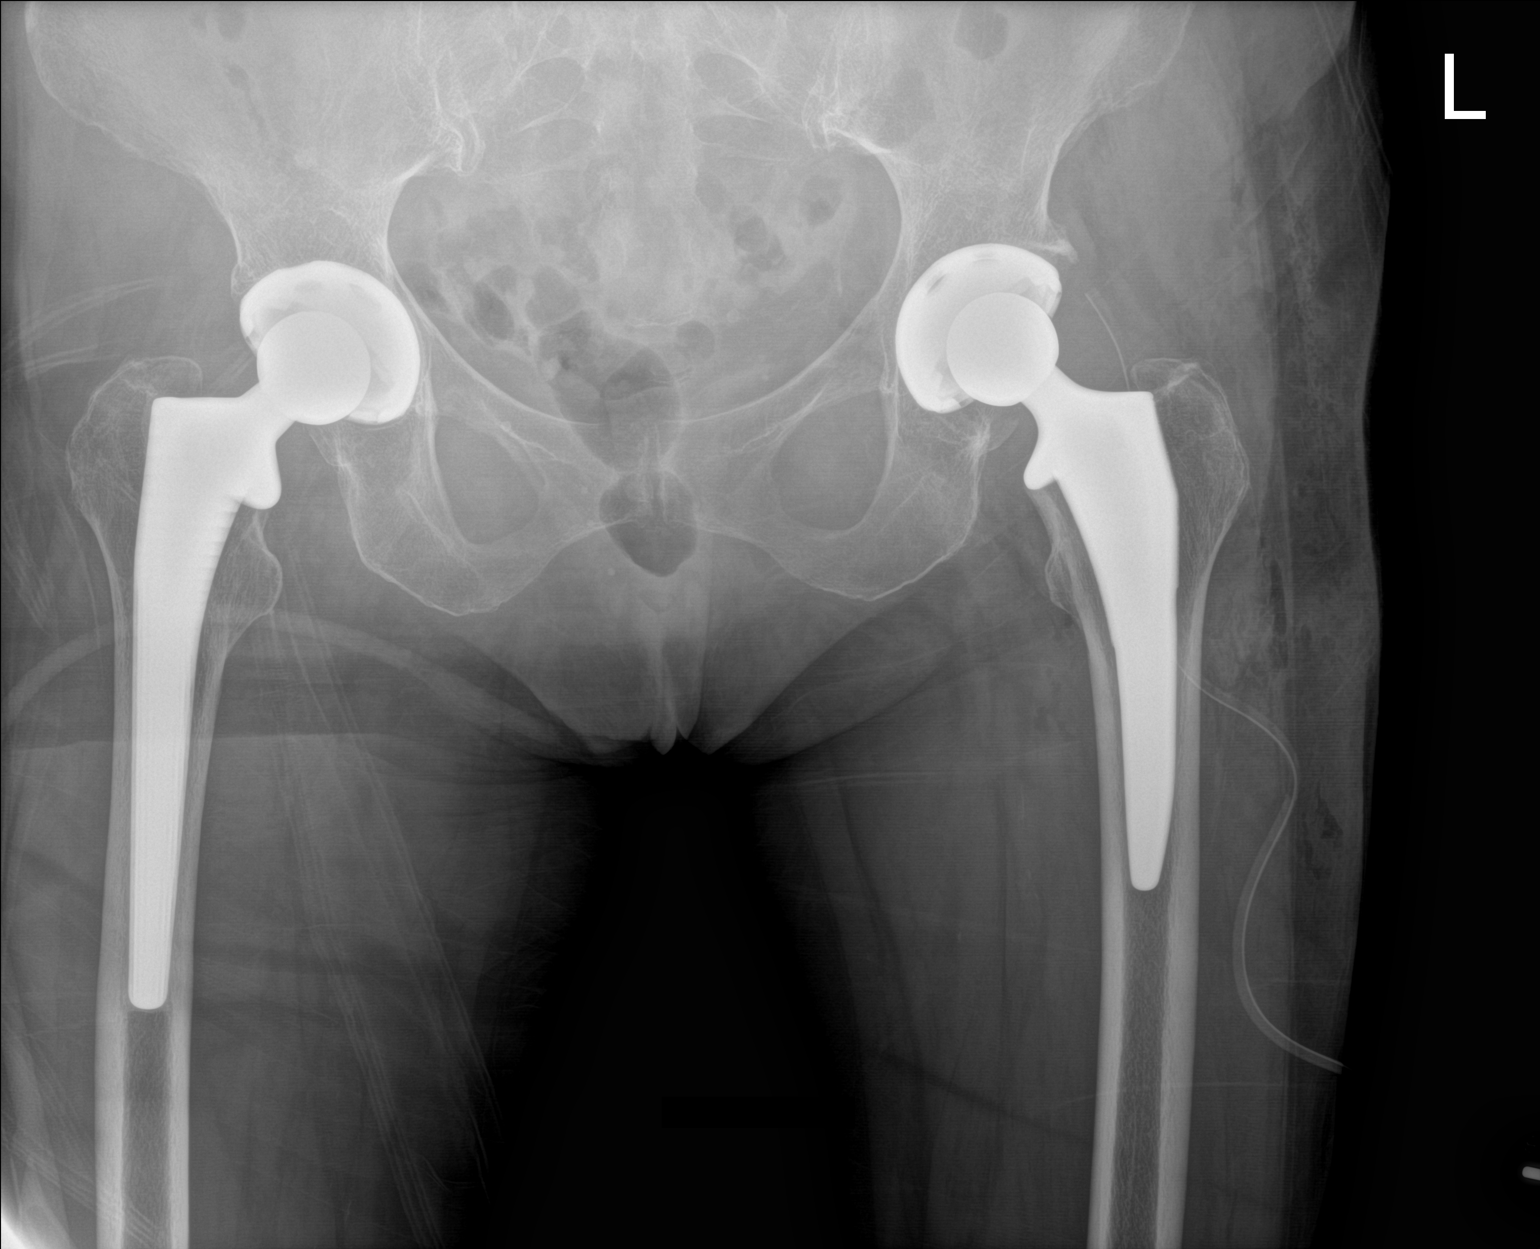

[1 of 1 positions shown; findings below may reference images not displayed]

FINDINGS: The patient is status post a total left arthroplasty. The hardware
appears intact and well aligned. A surgical drain overlies the
operative site. A right hip total arthroplasty is also noted.
IMPRESSION: Normal postoperative appearance after total left hip arthroplasty.
No evidence of hardware complication.

## 2020-01-05 DIAGNOSIS — M5416 Radiculopathy, lumbar region: Secondary | ICD-10-CM | POA: Diagnosis not present

## 2020-01-12 DIAGNOSIS — M5416 Radiculopathy, lumbar region: Secondary | ICD-10-CM | POA: Diagnosis not present

## 2020-02-18 DIAGNOSIS — M5416 Radiculopathy, lumbar region: Secondary | ICD-10-CM | POA: Diagnosis not present

## 2020-02-23 DIAGNOSIS — Z6821 Body mass index (BMI) 21.0-21.9, adult: Secondary | ICD-10-CM | POA: Diagnosis not present

## 2020-02-23 DIAGNOSIS — T148XXA Other injury of unspecified body region, initial encounter: Secondary | ICD-10-CM | POA: Diagnosis not present

## 2020-02-23 DIAGNOSIS — Z23 Encounter for immunization: Secondary | ICD-10-CM | POA: Diagnosis not present

## 2020-03-05 DIAGNOSIS — M545 Low back pain: Secondary | ICD-10-CM | POA: Diagnosis not present

## 2020-04-21 DIAGNOSIS — Z79891 Long term (current) use of opiate analgesic: Secondary | ICD-10-CM | POA: Diagnosis not present

## 2020-04-21 DIAGNOSIS — Z79899 Other long term (current) drug therapy: Secondary | ICD-10-CM | POA: Diagnosis not present

## 2020-04-21 DIAGNOSIS — G894 Chronic pain syndrome: Secondary | ICD-10-CM | POA: Diagnosis not present

## 2020-04-21 DIAGNOSIS — M47816 Spondylosis without myelopathy or radiculopathy, lumbar region: Secondary | ICD-10-CM | POA: Diagnosis not present

## 2020-04-21 DIAGNOSIS — M5136 Other intervertebral disc degeneration, lumbar region: Secondary | ICD-10-CM | POA: Diagnosis not present

## 2020-04-21 DIAGNOSIS — M160 Bilateral primary osteoarthritis of hip: Secondary | ICD-10-CM | POA: Diagnosis not present

## 2020-05-19 DIAGNOSIS — Z79899 Other long term (current) drug therapy: Secondary | ICD-10-CM | POA: Diagnosis not present

## 2020-05-19 DIAGNOSIS — G894 Chronic pain syndrome: Secondary | ICD-10-CM | POA: Diagnosis not present

## 2020-05-19 DIAGNOSIS — M47816 Spondylosis without myelopathy or radiculopathy, lumbar region: Secondary | ICD-10-CM | POA: Diagnosis not present

## 2020-05-19 DIAGNOSIS — Z79891 Long term (current) use of opiate analgesic: Secondary | ICD-10-CM | POA: Diagnosis not present

## 2020-05-19 DIAGNOSIS — M79651 Pain in right thigh: Secondary | ICD-10-CM | POA: Diagnosis not present

## 2020-05-19 DIAGNOSIS — M5136 Other intervertebral disc degeneration, lumbar region: Secondary | ICD-10-CM | POA: Diagnosis not present

## 2020-06-15 DIAGNOSIS — M79651 Pain in right thigh: Secondary | ICD-10-CM | POA: Diagnosis not present

## 2020-06-15 DIAGNOSIS — M47816 Spondylosis without myelopathy or radiculopathy, lumbar region: Secondary | ICD-10-CM | POA: Diagnosis not present

## 2020-06-15 DIAGNOSIS — M5136 Other intervertebral disc degeneration, lumbar region: Secondary | ICD-10-CM | POA: Diagnosis not present

## 2020-06-15 DIAGNOSIS — G894 Chronic pain syndrome: Secondary | ICD-10-CM | POA: Diagnosis not present

## 2020-07-19 DIAGNOSIS — M546 Pain in thoracic spine: Secondary | ICD-10-CM | POA: Diagnosis not present

## 2020-07-19 DIAGNOSIS — Z6822 Body mass index (BMI) 22.0-22.9, adult: Secondary | ICD-10-CM | POA: Diagnosis not present

## 2020-07-30 DIAGNOSIS — M546 Pain in thoracic spine: Secondary | ICD-10-CM | POA: Diagnosis not present

## 2020-08-10 DIAGNOSIS — M546 Pain in thoracic spine: Secondary | ICD-10-CM | POA: Diagnosis not present

## 2020-08-12 DIAGNOSIS — M47816 Spondylosis without myelopathy or radiculopathy, lumbar region: Secondary | ICD-10-CM | POA: Diagnosis not present

## 2020-08-12 DIAGNOSIS — Z79891 Long term (current) use of opiate analgesic: Secondary | ICD-10-CM | POA: Diagnosis not present

## 2020-08-12 DIAGNOSIS — M79651 Pain in right thigh: Secondary | ICD-10-CM | POA: Diagnosis not present

## 2020-08-12 DIAGNOSIS — M5136 Other intervertebral disc degeneration, lumbar region: Secondary | ICD-10-CM | POA: Diagnosis not present

## 2020-08-12 DIAGNOSIS — G894 Chronic pain syndrome: Secondary | ICD-10-CM | POA: Diagnosis not present

## 2020-08-12 DIAGNOSIS — Z79899 Other long term (current) drug therapy: Secondary | ICD-10-CM | POA: Diagnosis not present

## 2020-09-09 DIAGNOSIS — M79651 Pain in right thigh: Secondary | ICD-10-CM | POA: Diagnosis not present

## 2020-09-09 DIAGNOSIS — G894 Chronic pain syndrome: Secondary | ICD-10-CM | POA: Diagnosis not present

## 2020-09-09 DIAGNOSIS — M47816 Spondylosis without myelopathy or radiculopathy, lumbar region: Secondary | ICD-10-CM | POA: Diagnosis not present

## 2020-09-09 DIAGNOSIS — M5136 Other intervertebral disc degeneration, lumbar region: Secondary | ICD-10-CM | POA: Diagnosis not present

## 2020-09-20 DIAGNOSIS — H9209 Otalgia, unspecified ear: Secondary | ICD-10-CM | POA: Diagnosis not present

## 2020-10-12 DIAGNOSIS — M546 Pain in thoracic spine: Secondary | ICD-10-CM | POA: Diagnosis not present

## 2020-10-14 DIAGNOSIS — R5382 Chronic fatigue, unspecified: Secondary | ICD-10-CM | POA: Diagnosis not present

## 2020-10-14 DIAGNOSIS — E782 Mixed hyperlipidemia: Secondary | ICD-10-CM | POA: Diagnosis not present

## 2020-10-14 DIAGNOSIS — E7849 Other hyperlipidemia: Secondary | ICD-10-CM | POA: Diagnosis not present

## 2020-10-14 DIAGNOSIS — I10 Essential (primary) hypertension: Secondary | ICD-10-CM | POA: Diagnosis not present

## 2020-10-14 DIAGNOSIS — E559 Vitamin D deficiency, unspecified: Secondary | ICD-10-CM | POA: Diagnosis not present

## 2020-10-19 DIAGNOSIS — I10 Essential (primary) hypertension: Secondary | ICD-10-CM | POA: Diagnosis not present

## 2020-10-19 DIAGNOSIS — S22050D Wedge compression fracture of T5-T6 vertebra, subsequent encounter for fracture with routine healing: Secondary | ICD-10-CM | POA: Diagnosis not present

## 2020-10-19 DIAGNOSIS — Z1389 Encounter for screening for other disorder: Secondary | ICD-10-CM | POA: Diagnosis not present

## 2020-10-19 DIAGNOSIS — E7849 Other hyperlipidemia: Secondary | ICD-10-CM | POA: Diagnosis not present

## 2020-10-19 DIAGNOSIS — Z6821 Body mass index (BMI) 21.0-21.9, adult: Secondary | ICD-10-CM | POA: Diagnosis not present

## 2020-10-19 DIAGNOSIS — S22080A Wedge compression fracture of T11-T12 vertebra, initial encounter for closed fracture: Secondary | ICD-10-CM | POA: Diagnosis not present

## 2020-10-19 DIAGNOSIS — Z0001 Encounter for general adult medical examination with abnormal findings: Secondary | ICD-10-CM | POA: Diagnosis not present

## 2020-10-21 DIAGNOSIS — G894 Chronic pain syndrome: Secondary | ICD-10-CM | POA: Diagnosis not present

## 2020-10-21 DIAGNOSIS — M5136 Other intervertebral disc degeneration, lumbar region: Secondary | ICD-10-CM | POA: Diagnosis not present

## 2020-10-21 DIAGNOSIS — M79651 Pain in right thigh: Secondary | ICD-10-CM | POA: Diagnosis not present

## 2020-10-21 DIAGNOSIS — M160 Bilateral primary osteoarthritis of hip: Secondary | ICD-10-CM | POA: Diagnosis not present

## 2020-11-17 DIAGNOSIS — M26621 Arthralgia of right temporomandibular joint: Secondary | ICD-10-CM | POA: Diagnosis not present

## 2020-11-17 DIAGNOSIS — Z6821 Body mass index (BMI) 21.0-21.9, adult: Secondary | ICD-10-CM | POA: Diagnosis not present

## 2020-11-22 DIAGNOSIS — M810 Age-related osteoporosis without current pathological fracture: Secondary | ICD-10-CM | POA: Diagnosis not present

## 2020-11-22 DIAGNOSIS — Z780 Asymptomatic menopausal state: Secondary | ICD-10-CM | POA: Diagnosis not present

## 2020-11-22 DIAGNOSIS — M85831 Other specified disorders of bone density and structure, right forearm: Secondary | ICD-10-CM | POA: Diagnosis not present

## 2020-11-25 DIAGNOSIS — M47816 Spondylosis without myelopathy or radiculopathy, lumbar region: Secondary | ICD-10-CM | POA: Diagnosis not present

## 2020-11-25 DIAGNOSIS — Z79899 Other long term (current) drug therapy: Secondary | ICD-10-CM | POA: Diagnosis not present

## 2020-11-25 DIAGNOSIS — M5136 Other intervertebral disc degeneration, lumbar region: Secondary | ICD-10-CM | POA: Diagnosis not present

## 2020-11-25 DIAGNOSIS — Z79891 Long term (current) use of opiate analgesic: Secondary | ICD-10-CM | POA: Diagnosis not present

## 2020-11-25 DIAGNOSIS — M160 Bilateral primary osteoarthritis of hip: Secondary | ICD-10-CM | POA: Diagnosis not present

## 2020-11-25 DIAGNOSIS — G894 Chronic pain syndrome: Secondary | ICD-10-CM | POA: Diagnosis not present

## 2020-11-30 DIAGNOSIS — J019 Acute sinusitis, unspecified: Secondary | ICD-10-CM | POA: Diagnosis not present

## 2021-01-04 DIAGNOSIS — G894 Chronic pain syndrome: Secondary | ICD-10-CM | POA: Diagnosis not present

## 2021-01-04 DIAGNOSIS — M160 Bilateral primary osteoarthritis of hip: Secondary | ICD-10-CM | POA: Diagnosis not present

## 2021-01-04 DIAGNOSIS — M5136 Other intervertebral disc degeneration, lumbar region: Secondary | ICD-10-CM | POA: Diagnosis not present

## 2021-01-04 DIAGNOSIS — M79651 Pain in right thigh: Secondary | ICD-10-CM | POA: Diagnosis not present

## 2021-01-19 DIAGNOSIS — Z7689 Persons encountering health services in other specified circumstances: Secondary | ICD-10-CM | POA: Diagnosis not present

## 2021-01-19 DIAGNOSIS — Z6821 Body mass index (BMI) 21.0-21.9, adult: Secondary | ICD-10-CM | POA: Diagnosis not present

## 2021-03-28 DIAGNOSIS — Z08 Encounter for follow-up examination after completed treatment for malignant neoplasm: Secondary | ICD-10-CM | POA: Diagnosis not present

## 2021-03-28 DIAGNOSIS — Z85828 Personal history of other malignant neoplasm of skin: Secondary | ICD-10-CM | POA: Diagnosis not present

## 2021-03-28 DIAGNOSIS — C44329 Squamous cell carcinoma of skin of other parts of face: Secondary | ICD-10-CM | POA: Diagnosis not present

## 2021-05-26 DIAGNOSIS — G894 Chronic pain syndrome: Secondary | ICD-10-CM | POA: Diagnosis not present

## 2021-05-26 DIAGNOSIS — Z2821 Immunization not carried out because of patient refusal: Secondary | ICD-10-CM | POA: Diagnosis not present

## 2021-05-26 DIAGNOSIS — Z13820 Encounter for screening for osteoporosis: Secondary | ICD-10-CM | POA: Diagnosis not present

## 2021-09-23 DIAGNOSIS — H1031 Unspecified acute conjunctivitis, right eye: Secondary | ICD-10-CM | POA: Diagnosis not present

## 2021-10-03 DIAGNOSIS — H0288A Meibomian gland dysfunction right eye, upper and lower eyelids: Secondary | ICD-10-CM | POA: Diagnosis not present

## 2021-10-03 DIAGNOSIS — H0288B Meibomian gland dysfunction left eye, upper and lower eyelids: Secondary | ICD-10-CM | POA: Diagnosis not present

## 2021-11-23 DIAGNOSIS — M810 Age-related osteoporosis without current pathological fracture: Secondary | ICD-10-CM | POA: Diagnosis not present

## 2021-11-23 DIAGNOSIS — Z2821 Immunization not carried out because of patient refusal: Secondary | ICD-10-CM | POA: Diagnosis not present

## 2021-11-23 DIAGNOSIS — G894 Chronic pain syndrome: Secondary | ICD-10-CM | POA: Diagnosis not present

## 2021-11-24 DIAGNOSIS — H524 Presbyopia: Secondary | ICD-10-CM | POA: Diagnosis not present

## 2022-02-14 ENCOUNTER — Ambulatory Visit: Payer: Medicare Other | Admitting: "Endocrinology

## 2022-02-14 ENCOUNTER — Encounter: Payer: Self-pay | Admitting: "Endocrinology

## 2022-02-14 VITALS — BP 134/84 | HR 88 | Ht 59.5 in | Wt 125.8 lb

## 2022-02-14 DIAGNOSIS — M810 Age-related osteoporosis without current pathological fracture: Secondary | ICD-10-CM | POA: Diagnosis not present

## 2022-02-14 NOTE — Progress Notes (Signed)
02/14/2022      Endocrinology Consult Note  Past Medical History:  Diagnosis Date   Anxiety    because of first surgery   Arthritis    Cancer ([place])    hx of skin cancer right cheek   Osteoporosis    Past Surgical History:  Procedure Laterality Date   NO PAST SURGERIES     TOTAL HIP ARTHROPLASTY Right 11/22/2016   Procedure: RIGHT TOTAL HIP ARTHROPLASTY ANTERIOR APPROACH;  Surgeon: Gaynelle Arabian, MD;  Location: [facility];  Service: Orthopedics;  Laterality: Right;   TOTAL HIP ARTHROPLASTY Left 06/11/2019   Procedure: TOTAL HIP ARTHROPLASTY ANTERIOR APPROACH;  Surgeon: Gaynelle Arabian, MD;  Location: [facility];  Service: Orthopedics;  Laterality: Left;  169mn   Social History   Socioeconomic History   Marital status: Widowed    Spouse name: Not on file   Number of children: Not on file   Years of education: Not on file   Highest education level: Not on file  Occupational History   Not on file  Tobacco Use   Smoking status: Never   Smokeless tobacco: Never  Vaping Use   Vaping Use: Never used  Substance and Sexual Activity   Alcohol use: Yes    Alcohol/week: 1.0 standard drink of alcohol    Types: 1 Glasses of wine per week    Comment: twice a week   Drug use: No   Sexual activity: Not Currently  Other Topics Concern   Not on file  Social History Narrative   Not on file   Social Determinants of Health   Financial Resource Strain: Not on file  Food Insecurity: Not on file  Transportation Needs: Not on file  Physical Activity: Not on file  Stress: Not on file  Social Connections: Not on file   Outpatient Encounter Medications as of 02/14/2022  Medication Sig   TURMERIC-GINGER PO Take 1 tablet by mouth daily.   cyclobenzaprine (FLEXERIL) 5 MG tablet Take 2.5 mg by mouth at bedtime as needed.   PROLIA 60 MG/ML SOSY injection SMARTSIG:1 Milliliter(s) SUB-Q Twice a Year   [DISCONTINUED] methocarbamol (ROBAXIN)  500 MG tablet Take 1 tablet (500 mg total) by mouth every 6 (six) hours as needed for muscle spasms.   [DISCONTINUED] oxyCODONE (OXY IR/ROXICODONE) 5 MG immediate release tablet Take 1-2 tablets (5-10 mg total) by mouth every 6 (six) hours as needed for severe pain.   [DISCONTINUED] rivaroxaban (XARELTO) 10 MG TABS tablet Take 1 tablet (10 mg total) by mouth daily with breakfast for 20 days. Take one tablet Xarelto once a day for three weeks following surgery. Then change to one baby Aspirin (81 mg) once a day for three weeks. Then discontinue Aspirin.   [DISCONTINUED] traMADol (ULTRAM) 50 MG tablet Take 1-2 tablets (50-100 mg total) by mouth every 6 (six) hours as needed for moderate pain.   No facility-administered encounter medications on file as of 02/14/2022.   ALLERGIES: Allergies  Allergen Reactions   Asa [Aspirin]     Pt states "makes stomach raw"  Pt can take motrin   Pork-Derived Products     Dizziness, elevated BP   Sulfa Antibiotics Other (  See Comments)    unknown    VACCINATION STATUS:  There is no immunization history on file for this patient.   HPI   Natalie Tran is 81 y.o. female who presents today with a medical history as above. she is being seen in consultation for osteoporosis  requested by Wilburt Finlay, MD.  Patient was diagnosed with osteoporosis  approximately  5 years ago.  She reports medical management of fractures in the past.  She is status post bilateral hip replacement.  No recent falls, dizziness, orthostasis.    Based on her recent bone density in March 2022, she was initiated on Prolia injection.  She has taken Prolia injections x3, last injection was in April 2023.  No h/o vitamin D deficiency, she is not on vitamin D supplement, not on calcium supplement.  Her most recent labs show calcium of 9 mg per DL.  She denies any prior history of parathyroid, thyroid dysfunction. She also eats dairy and green, leafy, vegetables.   No weight bearing  exercises.  She reports losing 2 inches of height.  She does not take high vitamin A doses.  No h/o hyper/hypocalcemia. No h/o hyperparathyroidism. No h/o kidney stones. She does not have recent thyroid function test to review.  She denies chronic disease patient with CKD,  Dizziness, no rheumatoid conditions. She is postmenopausal for at least 30 years.  Pt does not have a FH of osteoporosis. Her other medical history includes osteoarthritis , anxiety.  Review of Systems  Constitutional: + Minimally fluctuating body weight, no fatigue, patient stays that  Eyes: no blurry vision, no xerophthalmia ENT: no sore throat, no nodules palpated in throat, no dysphagia/odynophagia, no hoarseness Cardiovascular: no Chest Pain, no Shortness of Breath, no palpitations, no leg swelling Respiratory: no cough, no SOB Gastrointestinal: no Nausea/Vomiting/Diarhhea Musculoskeletal: no muscle/joint aches Skin: no rashes Neurological: no tremors, no numbness, no tingling, no dizziness Psychiatric: no depression, no anxiety  Objective:    BP 134/84   Pulse 88   Ht 4' 11.5" (1.511 m)   Wt 125 lb 12.8 oz (57.1 kg)   LMP  (LMP Unknown)   BMI 24.98 kg/m   Wt Readings from Last 3 Encounters:  02/14/22 125 lb 12.8 oz (57.1 kg)  06/11/19 132 lb (59.9 kg)  06/03/19 132 lb (59.9 kg)    Physical Exam  Constitutional: + BMI 24.9, not in acute distress, normal state of mind Eyes: PERRLA, EOMI, no exophthalmos ENT: moist mucous membranes, no thyromegaly, no cervical lymphadenopathy Cardiovascular: normal precordial activity, Regular Rate and Rhythm, no Murmur/Rubs/Gallops Respiratory:  adequate breathing efforts, no gross chest deformity, Clear to auscultation bilaterally Gastrointestinal: abdomen soft, Non -tender, No distension, Bowel Sounds present Musculoskeletal: no gross deformities, strength intact in all four extremities Skin: moist, warm, no rashes Neurological: no tremor with outstretched  hands, Deep tendon reflexes normal in all four extremities.  CMP ( most recent) CMP     Component Value Date/Time   NA 140 06/12/2019 0232   K 4.5 06/12/2019 0232   CL 108 06/12/2019 0232   CO2 25 06/12/2019 0232   GLUCOSE 144 (H) 06/12/2019 0232   BUN 11 06/12/2019 0232   CREATININE 0.71 06/12/2019 0232   CALCIUM 9.0 06/12/2019 0232   PROT 7.8 06/03/2019 1441   ALBUMIN 4.6 06/03/2019 1441   AST 25 06/03/2019 1441   ALT 20 06/03/2019 1441   ALKPHOS 78 06/03/2019 1441   BILITOT 0.9 06/03/2019 1441   GFRNONAA >60 06/12/2019 0232  GFRAA >60 06/12/2019 0232     Assessment: 1. Osteoporosis  Plan: 1. Osteoporosis - likely postmenopausal  - Discussed about increased risk of fracture, depending on the T score, greatly increased when the T score is lower than -2.5.  Her most recent bone density showed T score of -2.8 on the spine.  Bilateral hips not included due to surgery.  Her T score was -4.2 on distal radius. she has an increased risk for fractures.  - We discussed about the different medication classes, benefits and side effects (including atypical fractures and ONJ - no dental workup in progress or planned).  - I explained that, since she is already on Prolia, she will be continued on Prolia 60 mg subcutaneously every 6 months. Her next injection is October 2023. - we reviewed her dietary and supplemental calcium and vitamin D intake.  She will need vitamin D supplement 2000 units daily.  Her calcium will be measured before her next visit.  - discussed fall precautions   Also, avoid smoking or >2 drinks of alcohol a day. - We will check the following tests: Vitamin D CMP Phosphorus PTH - will check her DEXA in March 2024 to assess treatment response.  Discussed fall precautions.  - I did not initiate any new prescriptions today. - I advised patient to maintain close follow up with K[place]uri, Tyler Deis, MD for primary care needs.  - Time spent with the patient: 50 minutes,  of which >50% was spent in obtaining information about her symptoms, reviewing her previous labs, evaluations, and treatments, counseling her about her osteoporosis, and developing a plan to confirm the diagnosis and long term treatment as necessary.  Joaquin Courts participated in the discussions, expressed understanding, and voiced agreement with the above plans.  All questions were answered to her satisfaction. she is encouraged to contact clinic should she have any questions or concerns prior to her return visit.  Follow up plan: Return in about 4 months (around 06/16/2022) for Prolia During NV.   Glade Lloyd, MD [facility] [address] [place], [place] [address] Phone: [phone]  Fax: [phone]     02/14/2022, 6:14 PM  This note was partially dictated with voice recognition software. Similar sounding words can be transcribed inadequately or may not  be corrected upon review.

## 2022-06-19 ENCOUNTER — Ambulatory Visit: Payer: Medicare Other | Admitting: "Endocrinology

## 2022-10-21 DIAGNOSIS — J069 Acute upper respiratory infection, unspecified: Secondary | ICD-10-CM | POA: Diagnosis not present

## 2022-10-21 DIAGNOSIS — R5383 Other fatigue: Secondary | ICD-10-CM | POA: Diagnosis not present

## 2022-10-21 DIAGNOSIS — M19049 Primary osteoarthritis, unspecified hand: Secondary | ICD-10-CM | POA: Diagnosis not present

## 2022-11-22 DIAGNOSIS — R300 Dysuria: Secondary | ICD-10-CM | POA: Diagnosis not present

## 2022-11-22 DIAGNOSIS — M810 Age-related osteoporosis without current pathological fracture: Secondary | ICD-10-CM | POA: Diagnosis not present

## 2022-11-22 DIAGNOSIS — Z299 Encounter for prophylactic measures, unspecified: Secondary | ICD-10-CM | POA: Diagnosis not present

## 2022-11-22 DIAGNOSIS — M109 Gout, unspecified: Secondary | ICD-10-CM | POA: Diagnosis not present

## 2022-12-01 DIAGNOSIS — M818 Other osteoporosis without current pathological fracture: Secondary | ICD-10-CM | POA: Diagnosis not present

## 2022-12-06 DIAGNOSIS — Z299 Encounter for prophylactic measures, unspecified: Secondary | ICD-10-CM | POA: Diagnosis not present

## 2022-12-06 DIAGNOSIS — M109 Gout, unspecified: Secondary | ICD-10-CM | POA: Diagnosis not present

## 2022-12-06 DIAGNOSIS — M810 Age-related osteoporosis without current pathological fracture: Secondary | ICD-10-CM | POA: Diagnosis not present

## 2023-02-06 DIAGNOSIS — M810 Age-related osteoporosis without current pathological fracture: Secondary | ICD-10-CM | POA: Diagnosis not present

## 2023-02-06 DIAGNOSIS — Z299 Encounter for prophylactic measures, unspecified: Secondary | ICD-10-CM | POA: Diagnosis not present

## 2023-03-21 ENCOUNTER — Ambulatory Visit (INDEPENDENT_AMBULATORY_CARE_PROVIDER_SITE_OTHER): Payer: Medicare Other | Admitting: Obstetrics and Gynecology

## 2023-03-21 ENCOUNTER — Encounter: Payer: Self-pay | Admitting: Obstetrics and Gynecology

## 2023-03-21 VITALS — BP 153/85 | HR 86 | Ht 59.8 in | Wt 124.2 lb

## 2023-03-21 DIAGNOSIS — N8110 Cystocele, unspecified: Secondary | ICD-10-CM | POA: Diagnosis not present

## 2023-03-21 DIAGNOSIS — N816 Rectocele: Secondary | ICD-10-CM | POA: Diagnosis not present

## 2023-03-21 DIAGNOSIS — R350 Frequency of micturition: Secondary | ICD-10-CM

## 2023-03-21 DIAGNOSIS — N952 Postmenopausal atrophic vaginitis: Secondary | ICD-10-CM

## 2023-03-21 LAB — POCT URINALYSIS DIPSTICK
Bilirubin, UA: NEGATIVE
Blood, UA: NEGATIVE
Glucose, UA: NEGATIVE
Ketones, UA: NEGATIVE
Leukocytes, UA: NEGATIVE
Nitrite, UA: NEGATIVE
Protein, UA: NEGATIVE
Spec Grav, UA: 1.02 (ref 1.010–1.025)
Urobilinogen, UA: 0.2 E.U./dL
pH, UA: 6.5 (ref 5.0–8.0)

## 2023-03-21 MED ORDER — ESTRADIOL 0.1 MG/GM VA CREA: 0.5000 g | TOPICAL_CREAM | VAGINAL | 11 refills | Status: AC

## 2023-03-21 NOTE — Patient Instructions (Addendum)
Please use the estrogen cream every night for two weeks and then we will do the pessary   Consider your surgical options

## 2023-03-21 NOTE — Progress Notes (Signed)
[facility] New Patient Evaluation and Consultation  Referring Provider: Beatrix Fetters, MD PCP: Beatrix Fetters, MD Date of Service: 03/21/2023  SUBJECTIVE Chief Complaint: New Patient (Initial Visit) (Natalie Tran is a 82 y.o. female is here for prolapse.)  History of Present Illness: Natalie Tran is a 82 y.o. White or Caucasian female seen in consultation at the request of Dr. Shellia Carwin for evaluation of prolapse.    Review of records significant for: Previously seen a GYN in [place] who suggested a total hysterectomy.    Urinary Symptoms: Does not leak urine.    Day time voids 5.  Nocturia: 1 times per night to void. Voiding dysfunction: she empties her bladder well.  does not use a catheter to empty bladder.  When urinating, she feels a weak stream and difficulty starting urine stream Drinks: Coffee, 16oz Water, Gingerale, and Cranberry-Grape juice per day  UTIs: 0 UTI's in the last year.   Denies history of blood in urine, kidney or bladder stones, pyelonephritis, bladder cancer, and kidney cancer  Pelvic Organ Prolapse Symptoms:                  She Admits to a feeling of a bulge the vaginal area. It has been present for 1 years.  She Admits to seeing a bulge.  This bulge is bothersome.  Bowel Symptom: Bowel movements: 2 time(s) per day Stool consistency: soft  Straining: no.  Splinting: no.  Incomplete evacuation: no.  She Denies accidental bowel leakage / fecal incontinence Bowel regimen: stool softener Last colonoscopy: Date >10 years ago, Results WNL  Sexual Function Sexually active: no.  Sexual orientation: Straight Pain with sex: No  Pelvic Pain Denies pelvic pain    Past Medical History:  Past Medical History:  Diagnosis Date   Anxiety    because of first surgery   Arthritis    Cancer (HCC)    hx of skin cancer right cheek   Osteoporosis      Past Surgical History:   Past Surgical History:  Procedure Laterality  Date   NO PAST SURGERIES     TOTAL HIP ARTHROPLASTY Right 11/22/2016   Procedure: RIGHT TOTAL HIP ARTHROPLASTY ANTERIOR APPROACH;  Surgeon: Ollen Gross, MD;  Location: [facility];  Service: Orthopedics;  Laterality: Right;   TOTAL HIP ARTHROPLASTY Left 06/11/2019   Procedure: TOTAL HIP ARTHROPLASTY ANTERIOR APPROACH;  Surgeon: Ollen Gross, MD;  Location: [facility];  Service: Orthopedics;  Laterality: Left;      Past OB/GYN History: G5 P5 Vaginal deliveries: 5,  Forceps/ Vacuum deliveries: 0, Cesarean section: 0 Menopausal: Yes, at age 18 Last pap smear was 2023.  Any history of abnormal pap smears: no.   Medications: She has a current medication list which includes the following prescription(s): cyclobenzaprine, [START ON 03/22/2023] estradiol, prolia, and turmeric-ginger.   Allergies: Patient is allergic to asa [aspirin], pork-derived products, and sulfa antibiotics.   Social History:  Social History   Tobacco Use   Smoking status: Never   Smokeless tobacco: Never  Vaping Use   Vaping status: Never Used  Substance Use Topics   Alcohol use: Yes    Alcohol/week: 1.0 standard drink of alcohol    Types: 1 Glasses of wine per week    Comment: twice a week   Drug use: No    Relationship status: widowed She lives with son.   She is employed Lawyer. Regular exercise: Yes: walking History of abuse: No  Family History:  Family History  Problem Relation Age of Onset   Epilepsy Mother    Epilepsy Brother      Review of Systems: Review of Systems  Constitutional:  Positive for malaise/fatigue. Negative for chills and fever.  Respiratory:  Negative for cough, shortness of breath and wheezing.   Cardiovascular:  Negative for chest pain, palpitations and leg swelling.  Gastrointestinal:  Positive for abdominal pain and constipation.  Genitourinary:  Negative for dysuria, flank pain and urgency.  Musculoskeletal:  Negative for myalgias.  Neurological:  Negative for  dizziness, weakness and headaches.  Endo/Heme/Allergies:  Bruises/bleeds easily.  Psychiatric/Behavioral:  Negative for depression and suicidal ideas. The patient is not nervous/anxious.      OBJECTIVE Physical Exam: Vitals:   03/21/23 1348 03/21/23 1409  BP: (!) 168/89 (!) 153/85  Pulse: 80 86  Weight: 124 lb 3.2 oz (56.3 kg)   Height: 4' 11.8" (1.519 m)     Physical Exam Constitutional:      Appearance: Normal appearance.  Abdominal:     General: Abdomen is flat.     Palpations: Abdomen is soft.  Skin:    General: Skin is warm and dry.  Neurological:     Mental Status: She is alert.  Psychiatric:        Mood and Affect: Mood normal.        Behavior: Behavior normal.        Thought Content: Thought content normal.      GU / Detailed Urogynecologic Evaluation:  Pelvic Exam: Normal external female genitalia; Bartholin's and Skene's glands normal in appearance; urethral meatus normal in appearance, no urethral masses or discharge.   CST: negative  Speculum exam reveals normal vaginal mucosa with atrophy. Cervix normal appearance. Uterus normal single, nontender. Adnexa normal adnexa.    With apex supported, anterior compartment defect was present  Pelvic floor strength II/V  Pelvic floor musculature: Right levator non-tender, Right obturator non-tender, Left levator non-tender, Left obturator non-tender  POP-Q:   POP-Q  -0.5                                            Aa   -0.5                                           Ba  -5.5                                              C   4.5                                            Gh  3.5                                            Pb  6.5  tvl   -1.5                                            Ap  -1.5                                            Bp  -5                                              D      Rectal Exam:  Normal external exam  Post-Void Residual  (PVR) by Bladder Scan: In order to evaluate bladder emptying, we discussed obtaining a postvoid residual and she agreed to this procedure.  Procedure: The ultrasound unit was placed on the patient's abdomen in the suprapubic region after the patient had voided. A PVR of 179 ml was obtained by bladder scan.  Laboratory Results:  POC Urine: Negative for signs of infection  ASSESSMENT AND PLAN Ms. Melena is a 82 y.o. with:  1. Prolapse of anterior vaginal wall   2. Posterior vaginal wall prolapse   3. Vaginal atrophy   4. Urinary frequency    Patient has stage II/IV Anterior vaginal wall prolapse, I/IV Uterine Prolapse, and I-II/IV posterior vaginal wall prolpase. Patient is having a hard time urinating and is interested in a pessary. We briefly discussed surgical options as she is not interested in having her uterus removed or surgical repair with mesh. Suggested for consideration Hysteropexy with sacrospinous ligament fixation w/ anterior repair if needed or Colpocleisis. She would prefer to try pessary first.  As patient has vaginal atrophy will have her come back in a few weeks for pessary fitting once she has had some time to insert estrogen cream.  Patient to start estrogen cream nightly for 2 weeks, then twice weekly after. We discussed using her finger to massage it into the walls and around the urethra and clitoris.  Patient not concerned with urinary frequency at this time, we discussed the pessary may cause some leaking due to the re-support of the bladder wall but we could address that if needed.    Patient to return in 2 weeks for pessary fitting or sooner if needed.    Selmer Dominion, NP

## 2023-04-09 ENCOUNTER — Ambulatory Visit: Payer: Medicare Other | Admitting: Obstetrics and Gynecology

## 2023-04-09 ENCOUNTER — Other Ambulatory Visit (HOSPITAL_COMMUNITY)
Admission: RE | Admit: 2023-04-09 | Discharge: 2023-04-09 | Disposition: A | Discharge status: Home / Self Care | Payer: Medicare Other | Source: Ambulatory Visit | Attending: Obstetrics and Gynecology | Admitting: Obstetrics and Gynecology

## 2023-04-09 ENCOUNTER — Encounter: Payer: Self-pay | Admitting: Obstetrics and Gynecology

## 2023-04-09 VITALS — BP 133/88 | HR 106

## 2023-04-09 DIAGNOSIS — N898 Other specified noninflammatory disorders of vagina: Secondary | ICD-10-CM | POA: Diagnosis present

## 2023-04-09 DIAGNOSIS — N8110 Cystocele, unspecified: Secondary | ICD-10-CM | POA: Diagnosis not present

## 2023-04-09 DIAGNOSIS — R350 Frequency of micturition: Secondary | ICD-10-CM | POA: Diagnosis not present

## 2023-04-09 DIAGNOSIS — N952 Postmenopausal atrophic vaginitis: Secondary | ICD-10-CM

## 2023-04-09 DIAGNOSIS — N816 Rectocele: Secondary | ICD-10-CM

## 2023-04-09 LAB — POCT URINALYSIS DIPSTICK
Bilirubin, UA: NEGATIVE
Blood, UA: NEGATIVE
Glucose, UA: NEGATIVE
Ketones, UA: NEGATIVE
Leukocytes, UA: NEGATIVE
Nitrite, UA: NEGATIVE
Protein, UA: NEGATIVE
Spec Grav, UA: 1.02 (ref 1.010–1.025)
Urobilinogen, UA: 0.2 E.U./dL
pH, UA: 6 (ref 5.0–8.0)

## 2023-04-09 MED ORDER — FLUCONAZOLE 150 MG PO TABS: 150.0000 mg | ORAL_TABLET | Freq: Every evening | ORAL | 0 refills | Status: DC

## 2023-04-09 NOTE — Patient Instructions (Signed)
Use some lubrication in the vagina if you need it or coconut oil.

## 2023-04-09 NOTE — Progress Notes (Addendum)
Natalie Tran   Subjective:     Chief Complaint: Pessary Fitting (Natalie Tran is a 82 y.o. female is here for pessary fitting.)  History of Present Illness: Natalie Tran is a 82 y.o. female with stage II pelvic organ prolapse who presents today for a pessary fitting.    Past Medical History: Patient  has a past medical history of Anxiety, Arthritis, Cancer (HCC), and Osteoporosis.   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   Medications: She has a current medication list which includes the following prescription(s): cyclobenzaprine, estradiol, prolia, and turmeric-ginger.   Allergies: Patient is allergic to asa [aspirin], pork-derived products, and sulfa antibiotics.   Social History: Patient  reports that she has never smoked. She has never used smokeless tobacco. She reports current alcohol use of about 1.0 standard drink of alcohol per week. She reports that she does not use drugs.      Objective:    BP 133/88   Pulse (!) 106   LMP  (LMP Unknown)  Gen: No apparent distress, A&O x 3. Pelvic Exam: Normal external female genitalia; Bartholin's and Skene's glands normal in appearance; urethral meatus normal in appearance, no urethral masses or discharge.   Attempted a size   A size #4 long stem gellhorn pessary was fitted. It was comfortable, stayed in place with valsalva and was an appropriate size on examination, with one finger fitting between the pessary and the vaginal walls. We tied a string to it and the patient demonstrated proper removal and replacement.   Assessment/Plan:    Assessment: Natalie Tran is a 82 y.o. with stage II pelvic organ prolapse who presents for a pessary fitting. Plan: She was fitted with a #4 long stem gellhorn pessary (Lot [id number]) . She will keep the pessary in place until next  visit. She will use coconut oil.   Patient also had some vaginal discharge suspicious for yeast. Aptima swab obtained and Diflucan ordered for patient.   Follow-up in 4 weeks for a pessary check or sooner as needed.  All questions were answered.    Selmer Dominion, NP

## 2023-04-12 ENCOUNTER — Telehealth: Payer: Self-pay | Admitting: Obstetrics and Gynecology

## 2023-04-12 DIAGNOSIS — B379 Candidiasis, unspecified: Secondary | ICD-10-CM

## 2023-04-12 MED ORDER — TERCONAZOLE 0.8 % VA CREA: 1.0000 | TOPICAL_CREAM | Freq: Every day | VAGINAL | 0 refills | Status: AC

## 2023-04-12 NOTE — Telephone Encounter (Signed)
Pt called in this morning.  Does not feel like Diflucan has taken care of the yeast infection she has.  She's still having some burning.  Her pharmacy is [facility].

## 2023-04-12 NOTE — Telephone Encounter (Signed)
Please call and inform patient that Terazol cream has been sent for her to use for the next 7 days

## 2023-05-08 ENCOUNTER — Ambulatory Visit: Payer: Medicare Other | Admitting: Obstetrics and Gynecology

## 2023-05-08 ENCOUNTER — Encounter: Payer: Self-pay | Admitting: Obstetrics and Gynecology

## 2023-05-08 VITALS — BP 153/84 | HR 80

## 2023-05-08 DIAGNOSIS — N8110 Cystocele, unspecified: Secondary | ICD-10-CM | POA: Diagnosis not present

## 2023-05-08 DIAGNOSIS — N952 Postmenopausal atrophic vaginitis: Secondary | ICD-10-CM

## 2023-05-08 DIAGNOSIS — N816 Rectocele: Secondary | ICD-10-CM | POA: Diagnosis not present

## 2023-05-08 NOTE — Progress Notes (Signed)
[facility]   Subjective:     Chief Complaint:  Chief Complaint  Patient presents with   Pessary Check    Natalie Tran is a 82 y.o. female here for a pessary check.    History of Present Illness: Natalie Tran is a 81 y.o. female with stage II pelvic organ prolapse who presents for a pessary check. She is using a size #4 long stem gellhorn pessary. The pessary has been working well and she has no complaints. She is using vaginal estrogen. She denies vaginal bleeding.  Past Medical History: Patient  has a past medical history of Anxiety, Arthritis, Cancer (HCC), and Osteoporosis.   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   Medications: She has a current medication list which includes the following prescription(s): cyclobenzaprine, estradiol, fluconazole, prolia, terconazole, and turmeric-ginger.   Allergies: Patient is allergic to asa [aspirin], pork-derived products, and sulfa antibiotics.   Social History: Patient  reports that she has never smoked. She has never used smokeless tobacco. She reports current alcohol use of about 1.0 standard drink of alcohol per week. She reports that she does not use drugs.      Objective:    Physical Exam: BP (!) 153/84   Pulse 80   LMP  (LMP Unknown)  Gen: No apparent distress, A&O x 3. Detailed Urogynecologic Evaluation:  Pelvic Exam: Normal external female genitalia; Bartholin's and Skene's glands normal in appearance; urethral meatus with caruncle, no urethral masses or discharge. The pessary was noted to be in place. It was removed and cleaned. Speculum exam revealed no lesions in the vagina. The pessary was replaced. It was comfortable to the patient and fit well.   Assessment/Plan:    Assessment: Natalie Tran is a 82 y.o. with stage II pelvic organ prolapse here for a  pessary check. She is doing well.  Plan: She will keep the pessary in place until next visit. She will continue to use estrogen. She will follow-up in 3 months for a pessary check or sooner as needed.  All questions were answered.

## 2023-06-05 DIAGNOSIS — K219 Gastro-esophageal reflux disease without esophagitis: Secondary | ICD-10-CM | POA: Diagnosis not present

## 2023-06-05 DIAGNOSIS — M810 Age-related osteoporosis without current pathological fracture: Secondary | ICD-10-CM | POA: Diagnosis not present

## 2023-06-05 DIAGNOSIS — Z299 Encounter for prophylactic measures, unspecified: Secondary | ICD-10-CM | POA: Diagnosis not present

## 2023-06-05 DIAGNOSIS — Z2821 Immunization not carried out because of patient refusal: Secondary | ICD-10-CM | POA: Diagnosis not present

## 2023-06-21 DIAGNOSIS — R5383 Other fatigue: Secondary | ICD-10-CM | POA: Diagnosis not present

## 2023-06-21 DIAGNOSIS — E7800 Pure hypercholesterolemia, unspecified: Secondary | ICD-10-CM | POA: Diagnosis not present

## 2023-06-21 DIAGNOSIS — Z7189 Other specified counseling: Secondary | ICD-10-CM | POA: Diagnosis not present

## 2023-06-21 DIAGNOSIS — M62830 Muscle spasm of back: Secondary | ICD-10-CM | POA: Diagnosis not present

## 2023-06-21 DIAGNOSIS — Z79899 Other long term (current) drug therapy: Secondary | ICD-10-CM | POA: Diagnosis not present

## 2023-06-21 DIAGNOSIS — Z0000 Encounter for general adult medical examination without abnormal findings: Secondary | ICD-10-CM | POA: Diagnosis not present

## 2023-06-21 DIAGNOSIS — Z299 Encounter for prophylactic measures, unspecified: Secondary | ICD-10-CM | POA: Diagnosis not present

## 2023-08-06 ENCOUNTER — Encounter: Payer: Self-pay | Admitting: Obstetrics and Gynecology

## 2023-08-06 ENCOUNTER — Ambulatory Visit: Payer: Medicare Other | Admitting: Obstetrics and Gynecology

## 2023-08-06 VITALS — BP 145/79 | HR 80

## 2023-08-06 DIAGNOSIS — N952 Postmenopausal atrophic vaginitis: Secondary | ICD-10-CM

## 2023-08-06 DIAGNOSIS — N812 Incomplete uterovaginal prolapse: Secondary | ICD-10-CM

## 2023-08-06 DIAGNOSIS — B379 Candidiasis, unspecified: Secondary | ICD-10-CM

## 2023-08-06 DIAGNOSIS — N8110 Cystocele, unspecified: Secondary | ICD-10-CM

## 2023-08-06 DIAGNOSIS — N816 Rectocele: Secondary | ICD-10-CM

## 2023-08-06 MED ORDER — FLUCONAZOLE 150 MG PO TABS: 150.0000 mg | ORAL_TABLET | ORAL | 0 refills | Status: DC | PRN

## 2023-08-06 NOTE — Patient Instructions (Signed)
You can take one dose of the diflucan now and one when you are done with antibiotics.

## 2023-08-06 NOTE — Progress Notes (Signed)
[facility]   Subjective:     Chief Complaint:  Chief Complaint  Patient presents with   Pessary Check    Natalie Tran is a 82 y.o. female is here for pessary check.   History of Present Illness: Natalie Tran is a 82 y.o. female with stage II pelvic organ prolapse who presents for a pessary check. She is using a size #4 long stem gellhorn pessary. The pessary has been working well and she has no complaints. She is using vaginal estrogen. She denies vaginal bleeding.  Patient is currently on antibiotics for a wisdom tooth.   Past Medical History: Patient  has a past medical history of Anxiety, Arthritis, Cancer (HCC), and Osteoporosis.   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   Medications: She has a current medication list which includes the following prescription(s): cyclobenzaprine, estradiol, fluconazole, fluconazole, prolia, terconazole, and turmeric-ginger.   Allergies: Patient is allergic to asa [aspirin], pork-derived products, and sulfa antibiotics.   Social History: Patient  reports that she has never smoked. She has never used smokeless tobacco. She reports current alcohol use of about 1.0 standard drink of alcohol per week. She reports that she does not use drugs.      Objective:    Physical Exam: BP (!) 146/81   Pulse 88   LMP  (LMP Unknown)  Gen: No apparent distress, A&O x 3. Detailed Urogynecologic Evaluation:  Pelvic Exam: Normal external female genitalia; Bartholin's and Skene's glands normal in appearance; urethral meatus with caruncle, no urethral masses or discharge. The pessary was noted to be in place. It was removed and cleaned. Speculum exam revealed no lesions in the vagina but there was some cottage cheese discharge behind pessary. The pessary was replaced. It was comfortable to the  patient and fit well.   Assessment/Plan:    Assessment: Natalie Tran is a 82 y.o. with stage II pelvic organ prolapse here for a pessary check. She is doing well.  Plan: She will keep the pessary in place until next visit. She will continue to use estrogen. She will follow-up in 3 months for a pessary check or sooner as needed. I prescribed diflucan as it appeared to be a yeast infection which patient reports is common for her when on antibioitcs.   All questions were answered.

## 2023-08-07 ENCOUNTER — Ambulatory Visit: Payer: Medicare Other | Admitting: Obstetrics and Gynecology

## 2023-08-24 DIAGNOSIS — Z299 Encounter for prophylactic measures, unspecified: Secondary | ICD-10-CM | POA: Diagnosis not present

## 2023-08-24 DIAGNOSIS — Z0000 Encounter for general adult medical examination without abnormal findings: Secondary | ICD-10-CM | POA: Diagnosis not present

## 2023-08-24 DIAGNOSIS — K219 Gastro-esophageal reflux disease without esophagitis: Secondary | ICD-10-CM | POA: Diagnosis not present

## 2023-08-24 DIAGNOSIS — M810 Age-related osteoporosis without current pathological fracture: Secondary | ICD-10-CM | POA: Diagnosis not present

## 2023-10-23 DIAGNOSIS — J069 Acute upper respiratory infection, unspecified: Secondary | ICD-10-CM | POA: Diagnosis not present

## 2023-10-23 DIAGNOSIS — J3489 Other specified disorders of nose and nasal sinuses: Secondary | ICD-10-CM | POA: Diagnosis not present

## 2023-10-23 DIAGNOSIS — Z299 Encounter for prophylactic measures, unspecified: Secondary | ICD-10-CM | POA: Diagnosis not present

## 2023-10-23 DIAGNOSIS — K219 Gastro-esophageal reflux disease without esophagitis: Secondary | ICD-10-CM | POA: Diagnosis not present

## 2023-10-25 ENCOUNTER — Other Ambulatory Visit (HOSPITAL_COMMUNITY)
Admission: RE | Admit: 2023-10-25 | Discharge: 2023-10-25 | Disposition: A | Discharge status: Home / Self Care | Payer: Medicare Other | Source: Ambulatory Visit | Attending: Obstetrics and Gynecology | Admitting: Obstetrics and Gynecology

## 2023-10-25 ENCOUNTER — Ambulatory Visit: Payer: Medicare Other | Admitting: Obstetrics and Gynecology

## 2023-10-25 ENCOUNTER — Encounter: Payer: Self-pay | Admitting: Obstetrics and Gynecology

## 2023-10-25 VITALS — BP 119/75 | HR 92

## 2023-10-25 DIAGNOSIS — R1011 Right upper quadrant pain: Secondary | ICD-10-CM | POA: Diagnosis not present

## 2023-10-25 DIAGNOSIS — R82998 Other abnormal findings in urine: Secondary | ICD-10-CM

## 2023-10-25 DIAGNOSIS — N812 Incomplete uterovaginal prolapse: Secondary | ICD-10-CM | POA: Diagnosis not present

## 2023-10-25 DIAGNOSIS — R350 Frequency of micturition: Secondary | ICD-10-CM

## 2023-10-25 LAB — POCT URINALYSIS DIPSTICK
Bilirubin, UA: NEGATIVE
Blood, UA: NEGATIVE
Glucose, UA: NEGATIVE
Ketones, UA: NEGATIVE
Nitrite, UA: NEGATIVE
Protein, UA: NEGATIVE
Spec Grav, UA: 1.015 (ref 1.010–1.025)
Urobilinogen, UA: 0.2 U/dL
pH, UA: 5.5 (ref 5.0–8.0)

## 2023-10-25 NOTE — Patient Instructions (Signed)
 Please use the estrogen cream in the vagina twice a week  I have ordered a CT scan to see if we can figure out what caused the pain in your upper abdomen.

## 2023-10-25 NOTE — Progress Notes (Signed)
 [facility]   Subjective:     Chief Complaint:  Chief Complaint  Patient presents with   Follow-up    Natalie Tran is a 83 y.o. female is here for stomach/uterine pain   History of Present Illness: Natalie Tran is a 83 y.o. female with stage II pelvic organ prolapse who presents for a pessary check. She is using a size #4 long stem gellhorn pessary. The pessary has been working well and she has no complaints. She is not using vaginal estrogen. She denies vaginal bleeding.  Patient comes in today concerned about significant abdominal pain. She reports she is concerned the pessary has shifted and is causing her pain but states it started 2-3 weeks ago. She has had the pessary in place for almost 3 months.   Patient reports she has had no urinary symptoms, no rectal symptoms or bleeding and was recently bladed on Zithromax for a sinus infection.    Past Medical History: Patient  has a past medical history of Anxiety, Arthritis, Cancer (HCC), and Osteoporosis.   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   Medications: She has a current medication list which includes the following prescription(s): azithromycin, cyclobenzaprine, estradiol, fluconazole, fluconazole, prolia, terconazole, and turmeric-ginger.   Allergies: Patient is allergic to asa [aspirin], pork-derived products, and sulfa antibiotics.   Social History: Patient  reports that she has never smoked. She has never used smokeless tobacco. She reports current alcohol use of about 1.0 standard drink of alcohol per week. She reports that she does not use drugs.      Objective:    Physical Exam: BP 119/75   Pulse 92   LMP  (LMP Unknown)  Gen: Patient alert and oriented and speaking in full sentences  Patient has significant tenderness on the RUQ when palpated and  is guarding. No tenderness noted on LUQ or LLQ. RLQ mildly tender but could be referred pain.  Detailed Urogynecologic Evaluation:  Pelvic Exam: Normal external female genitalia; Bartholin's and Skene's glands normal in appearance; urethral meatus with caruncle, no urethral masses or discharge. The pessary was noted to be in place. It was removed and cleaned. Speculum exam revealed no lesions in the vagina. The pessary was replaced. It was comfortable to the patient and fit well.   Patient had no change in abdominal symptoms with pessary removed and was still tender on exam in RUQ and RLQ.    Laboratory Results: . Lab Results  Component Value Date   COLORU yellow 10/25/2023   CLARITYU clear 10/25/2023   GLUCOSEUR Negative 10/25/2023   BILIRUBINUR negative 10/25/2023   KETONESU negative 10/25/2023   SPECGRAV 1.015 10/25/2023   RBCUR negative 10/25/2023   PHUR 5.5 10/25/2023   PROTEINUR Negative 10/25/2023   UROBILINOGEN 0.2 10/25/2023   LEUKOCYTESUR Small (1+) (A) 10/25/2023     Assessment/Plan:    Assessment: Natalie Tran is a 83 y.o. with stage II pelvic organ prolapse here for a pessary check. She is doing well.  Plan:  Due to patient's significant tenderness on exam will order CT scan. Patient given strict instructions that if this continues or worsens she is to go to the ER for evaluation.   Will send urine for culture to rule out infection.   She will keep the pessary in place until next visit. She will continue to use estrogen. She will follow-up in 3 months  for a pessary check or sooner as needed.  All questions were answered.

## 2023-10-26 LAB — URINE CULTURE: Culture: NO GROWTH

## 2023-10-29 ENCOUNTER — Ambulatory Visit: Payer: Medicare Other | Admitting: Obstetrics and Gynecology

## 2023-10-29 DIAGNOSIS — J069 Acute upper respiratory infection, unspecified: Secondary | ICD-10-CM | POA: Diagnosis not present

## 2023-10-29 DIAGNOSIS — K219 Gastro-esophageal reflux disease without esophagitis: Secondary | ICD-10-CM | POA: Diagnosis not present

## 2023-10-29 DIAGNOSIS — Z299 Encounter for prophylactic measures, unspecified: Secondary | ICD-10-CM | POA: Diagnosis not present

## 2023-10-30 ENCOUNTER — Encounter: Payer: Self-pay | Admitting: Obstetrics and Gynecology

## 2023-10-30 DIAGNOSIS — J069 Acute upper respiratory infection, unspecified: Secondary | ICD-10-CM | POA: Diagnosis not present

## 2023-10-30 DIAGNOSIS — M810 Age-related osteoporosis without current pathological fracture: Secondary | ICD-10-CM | POA: Diagnosis not present

## 2023-10-30 DIAGNOSIS — Z299 Encounter for prophylactic measures, unspecified: Secondary | ICD-10-CM | POA: Diagnosis not present

## 2023-10-31 ENCOUNTER — Telehealth: Payer: Self-pay

## 2023-10-31 NOTE — Telephone Encounter (Signed)
-----   Message from Selmer Dominion sent at 10/25/2023  4:19 PM EST ----- Regarding: CT Can we please get prior auth for CT abdomen pelvis w/o contrast for RUQ abdominal pain

## 2023-10-31 NOTE — Telephone Encounter (Signed)
 Prior Authorization/Notification is not required for the requested service(s).  You are not required to submit a notification/prior authorization based on the information provided. If you have general questions about the prior authorization requirements, visit [email or web address] > Clinician Resources > Advance and Admission Notification Requirements. The number above acknowledges your notification. Please write this reference number down for future reference. If you would like to request an organization determination, please call us at [phone].  Decision ID #: [id number]  The number above acknowledges your inquiry and our response. Please write this number down and refer to it for future inquiries. Coverage and payment for an item or service is governed by the member's benefit plan document, and, if applicable, the provider's participation agreement with the Health Plan.

## 2023-11-05 ENCOUNTER — Ambulatory Visit: Payer: Medicare Other | Admitting: Obstetrics and Gynecology

## 2023-12-21 DIAGNOSIS — N390 Urinary tract infection, site not specified: Secondary | ICD-10-CM | POA: Diagnosis not present

## 2023-12-21 DIAGNOSIS — K219 Gastro-esophageal reflux disease without esophagitis: Secondary | ICD-10-CM | POA: Diagnosis not present

## 2023-12-21 DIAGNOSIS — Z299 Encounter for prophylactic measures, unspecified: Secondary | ICD-10-CM | POA: Diagnosis not present

## 2023-12-27 DIAGNOSIS — N390 Urinary tract infection, site not specified: Secondary | ICD-10-CM | POA: Diagnosis not present

## 2023-12-27 DIAGNOSIS — R030 Elevated blood-pressure reading, without diagnosis of hypertension: Secondary | ICD-10-CM | POA: Diagnosis not present

## 2023-12-27 DIAGNOSIS — Z299 Encounter for prophylactic measures, unspecified: Secondary | ICD-10-CM | POA: Diagnosis not present

## 2024-01-04 DIAGNOSIS — N390 Urinary tract infection, site not specified: Secondary | ICD-10-CM | POA: Diagnosis not present

## 2024-01-04 DIAGNOSIS — Z299 Encounter for prophylactic measures, unspecified: Secondary | ICD-10-CM | POA: Diagnosis not present

## 2024-01-04 DIAGNOSIS — M25551 Pain in right hip: Secondary | ICD-10-CM | POA: Diagnosis not present

## 2024-01-08 DIAGNOSIS — Z85828 Personal history of other malignant neoplasm of skin: Secondary | ICD-10-CM | POA: Diagnosis not present

## 2024-01-08 DIAGNOSIS — L821 Other seborrheic keratosis: Secondary | ICD-10-CM | POA: Diagnosis not present

## 2024-01-08 DIAGNOSIS — Z08 Encounter for follow-up examination after completed treatment for malignant neoplasm: Secondary | ICD-10-CM | POA: Diagnosis not present

## 2024-01-22 ENCOUNTER — Encounter: Payer: Self-pay | Admitting: Obstetrics and Gynecology

## 2024-01-22 ENCOUNTER — Ambulatory Visit: Payer: Medicare Other | Admitting: Obstetrics and Gynecology

## 2024-01-22 VITALS — BP 118/89 | HR 108

## 2024-01-22 DIAGNOSIS — N816 Rectocele: Secondary | ICD-10-CM

## 2024-01-22 DIAGNOSIS — N8110 Cystocele, unspecified: Secondary | ICD-10-CM

## 2024-01-22 DIAGNOSIS — N812 Incomplete uterovaginal prolapse: Secondary | ICD-10-CM | POA: Diagnosis not present

## 2024-01-22 NOTE — Patient Instructions (Addendum)
 Let me know if you have other symptoms or if you see bleeding or irritation.   We will see you in 3 months for a follow up pessary check.

## 2024-01-22 NOTE — Progress Notes (Signed)
 [facility]   Subjective:     Chief Complaint:  Chief Complaint  Patient presents with   Follow-up    Pt here today for a pessary check   History of Present Illness: Natalie Tran is a 83 y.o. female with stage II pelvic organ prolapse who presents for a pessary check. She is using a size #4 long stem gellhorn pessary. The pessary has been working well and she has no complaints. She is not using vaginal estrogen. She denies vaginal bleeding.   Past Medical History: Patient  has a past medical history of Anxiety, Arthritis, Cancer (HCC), and Osteoporosis.   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   Medications: She has a current medication list which includes the following prescription(s): azithromycin, cyclobenzaprine, estradiol , fluconazole , fluconazole , prolia, terconazole , and turmeric-ginger.   Allergies: Patient is allergic to asa [aspirin], pork-derived products, and sulfa antibiotics.   Social History: Patient  reports that she has never smoked. She has never used smokeless tobacco. She reports current alcohol use of about 1.0 standard drink of alcohol per week. She reports that she does not use drugs.      Objective:    Physical Exam: BP 118/89 (BP Location: Left Arm, Patient Position: Sitting, Cuff Size: Small)   Pulse (!) 108   LMP  (LMP Unknown)  Gen: No apparent distress, A&O x 3. Detailed Urogynecologic Evaluation:  Pelvic Exam: Normal external female genitalia; Bartholin's and Skene's glands normal in appearance; urethral meatus with caruncle, no urethral masses or discharge. The pessary was noted to be in place. It was removed and cleaned. Speculum exam revealed no lesions in the vagina but there was some cottage cheese discharge behind pessary. The pessary was replaced. It was comfortable to the patient  and fit well.   Assessment/Plan:    Assessment: Natalie Tran is a 83 y.o. with stage II pelvic organ prolapse here for a pessary check. She is doing well.  Plan: She will keep the pessary in place until next visit. She will continue to use estrogen. She will follow-up in 3 months for a pessary check or sooner as needed.  All questions were answered.

## 2024-01-31 DIAGNOSIS — H16142 Punctate keratitis, left eye: Secondary | ICD-10-CM | POA: Diagnosis not present

## 2024-02-14 DIAGNOSIS — H2513 Age-related nuclear cataract, bilateral: Secondary | ICD-10-CM | POA: Diagnosis not present

## 2024-04-24 ENCOUNTER — Ambulatory Visit: Admitting: Obstetrics and Gynecology

## 2024-04-24 VITALS — BP 132/78 | HR 74

## 2024-04-24 DIAGNOSIS — N812 Incomplete uterovaginal prolapse: Secondary | ICD-10-CM | POA: Diagnosis not present

## 2024-04-24 DIAGNOSIS — N816 Rectocele: Secondary | ICD-10-CM

## 2024-04-24 DIAGNOSIS — Z960 Presence of urogenital implants: Secondary | ICD-10-CM | POA: Diagnosis not present

## 2024-04-24 DIAGNOSIS — N8110 Cystocele, unspecified: Secondary | ICD-10-CM

## 2024-04-24 NOTE — Progress Notes (Signed)
 [facility]   Subjective:     Chief Complaint:  Chief Complaint  Patient presents with   Pessary Check    Natalie Tran is a 84 y.o. female is here for pessary check/cleaning.   History of Present Illness: Natalie Tran is a 83 y.o. female with stage II pelvic organ prolapse who presents for a pessary check. She is using a size #4 long stem gellhorn pessary. The pessary has been working well and she has no complaints. She is not using vaginal estrogen but is using coconut oil. She denies vaginal bleeding.   Past Medical History: Patient  has a past medical history of Anxiety, Arthritis, Cancer (HCC), and Osteoporosis.   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   Medications: She has a current medication list which includes the following prescription(s): azithromycin, cyclobenzaprine, estradiol , fluconazole , fluconazole , prolia, terconazole , and turmeric-ginger.   Allergies: Patient is allergic to asa [aspirin], pork-derived products, and sulfa antibiotics.   Social History: Patient  reports that she has never smoked. She has never used smokeless tobacco. She reports current alcohol use of about 1.0 standard drink of alcohol per week. She reports that she does not use drugs.      Objective:    Physical Exam: BP 132/78   Pulse 74   LMP  (LMP Unknown)  Gen: No apparent distress, A&O x 3. Detailed Urogynecologic Evaluation:  Pelvic Exam: Normal external female genitalia; Bartholin's and Skene's glands normal in appearance; urethral meatus with caruncle, no urethral masses or discharge. The pessary was noted to be in place. It was removed and cleaned. Speculum exam revealed erythema in the vagina. A swab with hibiclens  was used to wipe out the vaginal region behind pessary. The pessary was replaced. It was comfortable to  the patient and fit well.   Assessment/Plan:    Assessment: [name] is a 83 y.o. with stage II pelvic organ prolapse here for a pessary check. She is doing well.  Plan: She will keep the pessary in place until next visit. She will continue to use estrogen. She will follow-up in 3 months for a pessary check or sooner as needed.  All questions were answered.

## 2024-07-28 ENCOUNTER — Ambulatory Visit: Admitting: Obstetrics and Gynecology

## 2024-07-28 ENCOUNTER — Encounter: Payer: Self-pay | Admitting: Obstetrics and Gynecology

## 2024-07-28 VITALS — BP 157/82 | HR 75

## 2024-07-28 DIAGNOSIS — N816 Rectocele: Secondary | ICD-10-CM

## 2024-07-28 DIAGNOSIS — Z960 Presence of urogenital implants: Secondary | ICD-10-CM | POA: Diagnosis not present

## 2024-07-28 DIAGNOSIS — N8110 Cystocele, unspecified: Secondary | ICD-10-CM

## 2024-07-28 DIAGNOSIS — N812 Incomplete uterovaginal prolapse: Secondary | ICD-10-CM | POA: Diagnosis not present

## 2024-07-28 DIAGNOSIS — N952 Postmenopausal atrophic vaginitis: Secondary | ICD-10-CM

## 2024-07-28 NOTE — Progress Notes (Signed)
 [facility]   Subjective:     Chief Complaint:  Chief Complaint  Patient presents with   Pessary Check    Natalie Tran is a 83 y.o. female is here for pessary check.    History of Present Illness: Natalie Tran is a 83 y.o. female with stage II pelvic organ prolapse who presents for a pessary check. She is using a size #4 long stem gellhorn pessary. The pessary has been working well and she has no complaints. She is not using vaginal estrogen consistently but is using coconut oil. She uses estrogen cream at times. She denies vaginal bleeding.   Past Medical History: Patient  has a past medical history of Anxiety, Arthritis, Cancer (HCC), and Osteoporosis.   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   Medications: She has a current medication list which includes the following prescription(s): cyclobenzaprine, estradiol , prolia, terconazole , and turmeric-ginger.   Allergies: Patient is allergic to asa [aspirin], porcine (pork) protein-containing drug products, and sulfa antibiotics.   Social History: Patient  reports that she has never smoked. She has never used smokeless tobacco. She reports current alcohol use of about 1.0 standard drink of alcohol per week. She reports that she does not use drugs.      Objective:    Physical Exam: BP (!) 157/82   Pulse 75   LMP  (LMP Unknown)  Gen: No apparent distress, A&O x 3. Detailed Urogynecologic Evaluation:  Pelvic Exam: Normal external female genitalia; Bartholin's and Skene's glands normal in appearance; urethral meatus with caruncle, no urethral masses or discharge. The pessary was noted to be in place. It was removed and cleaned. Speculum exam revealed no lesions in the vagina. A swab with hibiclens  was used to wipe out the vaginal region behind pessary. The pessary was  replaced. It was comfortable to the patient and fit well.   Assessment/Plan:    Assessment: Ms. Natalie Tran is a 83 y.o. with stage II pelvic organ prolapse here for a pessary check. She is doing well.  Plan: She will keep the pessary in place until next visit. She will continue to use estrogen. She will follow-up in 3 months for a pessary check or sooner as needed.  All questions were answered.

## 2024-08-12 ENCOUNTER — Telehealth (HOSPITAL_COMMUNITY): Payer: Self-pay | Admitting: Pharmacist

## 2024-08-12 ENCOUNTER — Other Ambulatory Visit (HOSPITAL_COMMUNITY): Payer: Self-pay | Admitting: Family Medicine

## 2024-08-12 ENCOUNTER — Other Ambulatory Visit (HOSPITAL_COMMUNITY): Payer: Self-pay

## 2024-08-12 ENCOUNTER — Encounter: Payer: Self-pay | Admitting: Family Medicine

## 2024-08-12 DIAGNOSIS — M810 Age-related osteoporosis without current pathological fracture: Secondary | ICD-10-CM | POA: Insufficient documentation

## 2024-08-12 NOTE — Telephone Encounter (Signed)
 Received call back from [place], [place]  - her last 11/28/2023. She states she was told my [facility] that they cannot give Jubbonti. Appears that the PA that was approved through pharmacy benefit. Copay is $4.90. Called [facility] - they ran test claim with no issues. They will order drug and have it in stock tomorrow. MDO notified and patient notified.  Pharmacy will reach out to patient ot schedule Jubbonti dose. Referral closed.  Sherry Pennant, PharmD, MPH, BCPS, CPP Clinical Pharmacist

## 2024-08-12 NOTE — Telephone Encounter (Signed)
 Received Jubbonti referral from [facility].  Called clinic to determine if this is new start or continuation of treatment. Spoke with Randine, CMA.  Last Prolia dose was in 2023 . Insurance is requiring Jubbonti now  Sherry Pennant, PharmD, MPH, BCPS, CPP Clinical Pharmacist

## 2024-08-12 NOTE — Addendum Note (Signed)
 Addended by: DAYNE SHERRY RAMAN on: 08/12/2024 11:16 AM   Modules accepted: Orders

## 2024-10-27 ENCOUNTER — Ambulatory Visit: Admitting: Obstetrics

## 2024-11-06 ENCOUNTER — Ambulatory Visit: Admitting: Obstetrics
